# Patient Record
Sex: Female | Born: 1950 | Race: White | Hispanic: No | Marital: Married | Smoking: Never smoker
Health system: Southern US, Community
[De-identification: ages and names within clinical notes are randomized; demographics above are authoritative.]

## PROBLEM LIST (undated history)

## (undated) DIAGNOSIS — N6009 Solitary cyst of unspecified breast: Secondary | ICD-10-CM

## (undated) DIAGNOSIS — I493 Ventricular premature depolarization: Secondary | ICD-10-CM

## (undated) DIAGNOSIS — E079 Disorder of thyroid, unspecified: Secondary | ICD-10-CM

## (undated) DIAGNOSIS — I10 Essential (primary) hypertension: Secondary | ICD-10-CM

## (undated) DIAGNOSIS — E039 Hypothyroidism, unspecified: Secondary | ICD-10-CM

## (undated) HISTORY — PX: APPENDECTOMY: SHX54

## (undated) HISTORY — PX: BREAST CYST ASPIRATION: SHX578

## (undated) HISTORY — PX: EYE SURGERY: SHX253

## (undated) HISTORY — PX: CHOLECYSTECTOMY: SHX55

## (undated) HISTORY — PX: ANKLE SURGERY: SHX546

## (undated) HISTORY — PX: TONSILLECTOMY: SUR1361

---

## 1979-09-16 HISTORY — PX: OTHER SURGICAL HISTORY: SHX169

## 2003-09-14 ENCOUNTER — Other Ambulatory Visit: Payer: Self-pay

## 2010-12-30 ENCOUNTER — Ambulatory Visit: Payer: Self-pay | Admitting: Internal Medicine

## 2010-12-31 ENCOUNTER — Ambulatory Visit: Payer: Self-pay | Admitting: Surgery

## 2011-01-02 LAB — PATHOLOGY REPORT

## 2013-05-24 ENCOUNTER — Emergency Department: Payer: Self-pay | Admitting: Emergency Medicine

## 2013-05-24 LAB — CBC
HCT: 36.1 % (ref 35.0–47.0)
RBC: 4.2 10*6/uL (ref 3.80–5.20)
RDW: 13.8 % (ref 11.5–14.5)

## 2013-05-24 LAB — COMPREHENSIVE METABOLIC PANEL
Albumin: 3.8 g/dL (ref 3.4–5.0)
Alkaline Phosphatase: 116 U/L (ref 50–136)
BUN: 12 mg/dL (ref 7–18)
Bilirubin,Total: 0.3 mg/dL (ref 0.2–1.0)
Calcium, Total: 8.9 mg/dL (ref 8.5–10.1)
Creatinine: 0.88 mg/dL (ref 0.60–1.30)
EGFR (Non-African Amer.): >60
Osmolality: 275 (ref 275–301)
Potassium: 3.9 mmol/L (ref 3.5–5.1)
SGOT(AST): 25 U/L (ref 15–37)
Total Protein: 7.7 g/dL (ref 6.4–8.2)

## 2013-05-24 LAB — URINALYSIS, COMPLETE
Ketone: NEGATIVE
Leukocyte Esterase: NEGATIVE
Ph: 6 (ref 4.5–8.0)
Protein: NEGATIVE
RBC,UR: 1 /HPF (ref 0–5)
Squamous Epithelial: 6

## 2013-07-14 ENCOUNTER — Emergency Department: Payer: Self-pay | Admitting: Emergency Medicine

## 2013-07-14 LAB — T4, FREE: Free Thyroxine: 1.03 ng/dL (ref 0.76–1.46)

## 2013-07-14 LAB — COMPREHENSIVE METABOLIC PANEL
Albumin: 3.9 g/dL (ref 3.4–5.0)
Alkaline Phosphatase: 113 U/L (ref 50–136)
Anion Gap: 4 — ABNORMAL LOW (ref 7–16)
Bilirubin,Total: 0.3 mg/dL (ref 0.2–1.0)
Calcium, Total: 9.2 mg/dL (ref 8.5–10.1)
Chloride: 109 mmol/L — ABNORMAL HIGH (ref 98–107)
Co2: 27 mmol/L (ref 21–32)
Creatinine: 0.95 mg/dL (ref 0.60–1.30)
EGFR (African American): >60
EGFR (Non-African Amer.): >60
Glucose: 97 mg/dL (ref 65–99)
Osmolality: 278 (ref 275–301)
SGOT(AST): 33 U/L (ref 15–37)
SGPT (ALT): 26 U/L (ref 12–78)

## 2013-07-14 LAB — CBC
HGB: 12.8 g/dL (ref 12.0–16.0)
MCV: 87 fL (ref 80–100)
Platelet: 322 10*3/uL (ref 150–440)
RDW: 13.8 % (ref 11.5–14.5)

## 2013-07-14 LAB — CK TOTAL AND CKMB (NOT AT ARMC): CK-MB: 1.2 ng/mL (ref 0.5–3.6)

## 2013-07-14 LAB — TROPONIN I: Troponin-I: <0.02 ng/mL

## 2014-10-24 ENCOUNTER — Ambulatory Visit: Payer: Self-pay | Admitting: Internal Medicine

## 2015-06-28 ENCOUNTER — Ambulatory Visit
Admission: EM | Admit: 2015-06-28 | Discharge: 2015-06-28 | Disposition: A | Discharge status: Home / Self Care | Payer: Medicare Other | Attending: Family Medicine | Admitting: Family Medicine

## 2015-06-28 DIAGNOSIS — N390 Urinary tract infection, site not specified: Secondary | ICD-10-CM

## 2015-06-28 HISTORY — DX: Essential (primary) hypertension: I10

## 2015-06-28 HISTORY — DX: Disorder of thyroid, unspecified: E07.9

## 2015-06-28 LAB — URINALYSIS COMPLETE WITH MICROSCOPIC (ARMC ONLY)
Bilirubin Urine: NEGATIVE
Glucose, UA: NEGATIVE mg/dL
KETONES UR: NEGATIVE mg/dL
Nitrite: POSITIVE — AB
Protein, ur: NEGATIVE mg/dL
Specific Gravity, Urine: 1.005 (ref 1.005–1.030)
pH: 5 (ref 5.0–8.0)

## 2015-06-28 MED ORDER — NITROFURANTOIN MONOHYD MACRO 100 MG PO CAPS: 100.0000 mg | ORAL_CAPSULE | Freq: Two times a day (BID) | ORAL | Status: DC

## 2015-06-28 NOTE — Discharge Instructions (Signed)

## 2015-06-28 NOTE — ED Notes (Signed)
Pt states "I started having urinary pain yesterday."

## 2015-06-28 NOTE — ED Provider Notes (Signed)
[facility] Emergency Department Provider Note  ____________________________________________  Time seen: Approximately 8:47 AM  I have reviewed the triage vital signs and the nursing notes.   HISTORY  Chief Complaint Urinary Frequency   HPI Abigail Nguyen is a 64 y.o. female presents with complaints of urinary frequency, urgency and burning with urination. Patient reports symptoms present since yesterday evening. Patient reports some intermittent lower mid abdominal pressure but denies pain. States urinary pain is only at time of urination. Denies current pain. States urinary pain is 3-4 out of 10 described as burning. Denies pain radiation. Denies back pain. Denies fever, nausea, vomiting, diarrhea, constipation. Reports does continue to eat and drink well. Denies vaginal discharge or vaginal odor or vaginal pain. States took Azo over the counter which helped with urinary burning sensation, but turned urine red color. Denies redness in urine prior to taking Azo.   Patient does reports that she has a chronic history of urinary tract infection since a child. Patient reports in the past she follows with urology. Patient does report that her last urinary tract infection was approximately 1.5-2 years ago. Denies recent antibiotic use.    Past Medical History  Diagnosis Date  . Hypertension   . Thyroid disease   post menopausal   There are no active problems to display for this patient.   Past Surgical History  Procedure Laterality Date  . Cesarean section    . Tonsillectomy    . Appendectomy    . Cholecystectomy    . Ankle surgery    . Eye surgery      Current Outpatient Rx  Name  Route  Sig  Dispense  Refill  . levothyroxine (SYNTHROID, LEVOTHROID) 25 MCG tablet   Oral   Take 25 mcg by mouth daily before breakfast.         . lisinopril (PRINIVIL,ZESTRIL) 10 MG tablet   Oral   Take 10 mg by mouth daily.           Allergies Compazine and  Morphine and related  No family history on file.  Social History Social History  Substance Use Topics  . Smoking status: Never Smoker   . Smokeless tobacco: None  . Alcohol Use: No    Review of Systems Constitutional: No fever/chills Eyes: No visual changes. ENT: No sore throat. Cardiovascular: Denies chest pain. Respiratory: Denies shortness of breath. Gastrointestinal: No abdominal pain.  No nausea, no vomiting.  No diarrhea.  No constipation. Genitourinary: positive for dysuria. Musculoskeletal: Negative for back pain. Skin: Negative for rash. Neurological: Negative for headaches, focal weakness or numbness.  10-point ROS otherwise negative.  ____________________________________________   PHYSICAL EXAM:  VITAL SIGNS: ED Triage Vitals  Enc Vitals Group     BP 06/28/15 0840 123/75 mmHg     Pulse Rate 06/28/15 0840 84     Resp 06/28/15 0840 16     Temp 06/28/15 0840 98.4 F (36.9 C)     Temp Source 06/28/15 0840 Tympanic     SpO2 06/28/15 0840 100 %     Weight --      Height --      Head Cir --      Peak Flow --      Pain Score 06/28/15 0837 4     Pain Loc --      Pain Edu? --      Excl. in [place]? --     Constitutional: Alert and oriented. Well appearing and in no acute distress.  Eyes: Conjunctivae are normal. PERRL. EOMI. Head: Atraumatic.  Nose: No congestion/rhinnorhea.  Mouth/Throat: Mucous membranes are moist.  Oropharynx non-erythematous. Neck: No stridor.  No cervical spine tenderness to palpation. Hematological/Lymphatic/Immunilogical: No cervical lymphadenopathy. Cardiovascular: Normal rate, regular rhythm. Grossly normal heart sounds.  Good peripheral circulation. Respiratory: Normal respiratory effort.  No retractions. Lungs CTAB. Gastrointestinal: Soft and nontender. No distention. Normal Bowel sounds.  No abdominal bruits. No CVA tenderness. Musculoskeletal: No lower or upper extremity tenderness nor edema.  .  Neurologic:  Normal speech and  language. No gross focal neurologic deficits are appreciated. No gait instability. Skin:  Skin is warm, dry and intact. No rash noted. Psychiatric: Mood and affect are normal. Speech and behavior are normal.  ____________________________________________   LABS (all labs ordered are listed, but only abnormal results are displayed)  Labs Reviewed  URINALYSIS COMPLETEWITH MICROSCOPIC ([place]) - Abnormal; Notable for the following:    Color, Urine AMBER (*)    APPearance HAZY (*)    Hgb urine dipstick 1+ (*)    Nitrite POSITIVE (*)    Leukocytes, UA 2+ (*)    Bacteria, UA MANY (*)    Squamous Epithelial / LPF 0-5 (*)    All other components within normal limits  URINE CULTURE     INITIAL IMPRESSION / ASSESSMENT AND PLAN / ED COURSE  Pertinent labs & imaging results that were available during my care of the patient were reviewed by me and considered in my medical decision making (see chart for details).  Very well-appearing patient. No acute distress. Presents for the months of urinary frequency, urgency or burning urination 1 day. Patient reports history urinary tract infections and symptoms similar. Denies other complaints. Abdomen soft and nontender. Will evaluate urine.  Urinalysis positive for many bacteria, nitrite positive, 2+ leukocytes and too numerus to count WBCs. Will treat UTI with oral macrobid. Discussed follow up with Primary care physician this week. Discussed follow up and return parameters including no resolution or any worsening concerns. Patient verbalized understanding and agreed to plan.     ____________________________________________   FINAL CLINICAL IMPRESSION(S) / ED DIAGNOSES  Final diagnoses:  UTI (lower urinary tract infection)       Marylene Land, NP 06/28/15 1137

## 2015-06-30 LAB — URINE CULTURE: Culture: >=100000

## 2015-07-05 ENCOUNTER — Ambulatory Visit
Admission: EM | Admit: 2015-07-05 | Discharge: 2015-07-05 | Disposition: A | Discharge status: Home / Self Care | Payer: Medicare Other | Attending: Family Medicine | Admitting: Family Medicine

## 2015-07-05 DIAGNOSIS — N390 Urinary tract infection, site not specified: Secondary | ICD-10-CM | POA: Diagnosis not present

## 2015-07-05 LAB — URINALYSIS COMPLETE WITH MICROSCOPIC (ARMC ONLY)

## 2015-07-05 MED ORDER — CIPROFLOXACIN HCL 500 MG PO TABS: 500.0000 mg | ORAL_TABLET | Freq: Two times a day (BID) | ORAL | Status: DC

## 2015-07-05 NOTE — ED Provider Notes (Signed)
CSN: [id number]     Arrival date & time 07/05/15  1310 History   First MD Initiated Contact with Patient 07/05/15 1419     Chief Complaint  Patient presents with  . Cystitis   (Consider location/radiation/quality/duration/timing/severity/associated sxs/prior Treatment) HPI   This a 64 year old female who was seen here and 06/28/2015 diagnosed with a UTI and placed on Macrobid for 5 days. Patient states that she was improving at 5 days but 2 days after completing her course of therapy began to have the same symptoms as before. She has begun taken AZO because of the burning. Currently she is having the same symptoms of urgency dysuria frequency and in satiety. She is not having any fever or chills. Slight cramping in her lower abdomen and suprapubic area. She's had numerous UTIs in the past and she states that she usually takes 10 days of therapy in order to make the bacteria. Denies any vaginal discharge.  Past Medical History  Diagnosis Date  . Hypertension   . Thyroid disease    Past Surgical History  Procedure Laterality Date  . Cesarean section    . Tonsillectomy    . Appendectomy    . Cholecystectomy    . Ankle surgery    . Eye surgery     Family History  Problem Relation Age of Onset  . Cancer Mother   . Coronary artery disease Father    Social History  Substance Use Topics  . Smoking status: Never Smoker   . Smokeless tobacco: None  . Alcohol Use: Yes     Comment: rarely   OB History    No data available     Review of Systems  Constitutional: Positive for activity change. Negative for fever, chills and fatigue.  Genitourinary: Positive for dysuria, urgency, frequency and difficulty urinating. Negative for vaginal bleeding, vaginal discharge and vaginal pain.  All other systems reviewed and are negative.   Allergies  Compazine and Morphine and related  Home Medications   Prior to Admission medications   Medication Sig Start Date End Date Taking? Authorizing  Provider  levothyroxine (SYNTHROID, LEVOTHROID) 25 MCG tablet Take 25 mcg by mouth daily before breakfast.   Yes Historical Provider, MD  lisinopril (PRINIVIL,ZESTRIL) 10 MG tablet Take 10 mg by mouth daily.   Yes Historical Provider, MD  ciprofloxacin (CIPRO) 500 MG tablet Take 1 tablet (500 mg total) by mouth 2 (two) times daily. 07/05/15   Lorin Picket, PA-C  nitrofurantoin, macrocrystal-monohydrate, (MACROBID) 100 MG capsule Take 1 capsule (100 mg total) by mouth 2 (two) times daily. 06/28/15   Marylene Land, NP   Meds Ordered and Administered this Visit  Medications - No data to display  BP 131/74 mmHg  Pulse 88  Temp(Src) 96.6 F (35.9 C) (Tympanic)  Resp 16  Ht 5' 4.5" (1.638 m)  Wt 190 lb (86.183 kg)  BMI 32.12 kg/m2  SpO2 99% No data found.   Physical Exam  Constitutional: She is oriented to person, place, and time. She appears well-developed and well-nourished. No distress.  HENT:  Head: Normocephalic and atraumatic.  Eyes: Pupils are equal, round, and reactive to light.  Neck: Neck supple.  Pulmonary/Chest: Breath sounds normal. No respiratory distress. She has no wheezes. She has no rales.  Abdominal: Soft. Bowel sounds are normal. She exhibits no distension. There is no tenderness. There is no rebound and no guarding.  There is no CVA tenderness  Musculoskeletal: Normal range of motion. She exhibits no edema or tenderness.  Neurological: She is alert and oriented to person, place, and time.  Skin: Skin is warm and dry. She is not diaphoretic.  Psychiatric: She has a normal mood and affect. Her behavior is normal. Judgment and thought content normal.  Nursing note and vitals reviewed.   ED Course  Procedures (including critical care time)  Labs Review Labs Reviewed  URINALYSIS COMPLETEWITH MICROSCOPIC ([facility] ONLY) - Abnormal; Notable for the following:    Color, Urine ORANGE (*)    Glucose, UA   (*)    Value: TEST NOT REPORTED DUE TO COLOR INTERFERENCE OF  URINE PIGMENT   Bilirubin Urine   (*)    Value: TEST NOT REPORTED DUE TO COLOR INTERFERENCE OF URINE PIGMENT   Ketones, ur   (*)    Value: TEST NOT REPORTED DUE TO COLOR INTERFERENCE OF URINE PIGMENT   Hgb urine dipstick   (*)    Value: TEST NOT REPORTED DUE TO COLOR INTERFERENCE OF URINE PIGMENT   Protein, ur   (*)    Value: TEST NOT REPORTED DUE TO COLOR INTERFERENCE OF URINE PIGMENT   Nitrite   (*)    Value: TEST NOT REPORTED DUE TO COLOR INTERFERENCE OF URINE PIGMENT   Leukocytes, UA   (*)    Value: TEST NOT REPORTED DUE TO COLOR INTERFERENCE OF URINE PIGMENT   Bacteria, UA MANY (*)    Squamous Epithelial / LPF 0-5 (*)    All other components within normal limits  URINE CULTURE    Imaging Review No results found.   Visual Acuity Review  Right Eye Distance:   Left Eye Distance:   Bilateral Distance:    Right Eye Near:   Left Eye Near:    Bilateral Near:         MDM   1. Recurrent UTI (urinary tract infection)    New Prescriptions   CIPROFLOXACIN (CIPRO) 500 MG TABLET    Take 1 tablet (500 mg total) by mouth 2 (two) times daily.  Plan: 1. Test/x-ray results and diagnosis reviewed with patient 2. rx as per orders; risks, benefits, potential side effects reviewed with patient 3. Recommend supportive treatment with fluids,rest 4. F/u prn if symptoms worsen or don't improve     Lorin Picket, PA-C 07/05/15 2034

## 2015-07-05 NOTE — Discharge Instructions (Signed)

## 2015-07-05 NOTE — ED Notes (Signed)
Seen here at [facility] with UTI. Given Rx for Macrobid x 5 days. "Was helping", woke at 0300 hrs. This morning and burning with voiding. Took AZO. C/o headache and burning with voiding and low pelvic discomfort

## 2015-07-07 LAB — URINE CULTURE

## 2015-08-24 ENCOUNTER — Encounter: Payer: Self-pay | Admitting: Emergency Medicine

## 2015-08-24 ENCOUNTER — Ambulatory Visit
Admission: EM | Admit: 2015-08-24 | Discharge: 2015-08-24 | Disposition: A | Discharge status: Home / Self Care | Payer: Medicare Other | Attending: Emergency Medicine | Admitting: Emergency Medicine

## 2015-08-24 DIAGNOSIS — J069 Acute upper respiratory infection, unspecified: Secondary | ICD-10-CM

## 2015-08-24 MED ORDER — AZITHROMYCIN 250 MG PO TABS: ORAL_TABLET | ORAL | Status: DC

## 2015-08-24 NOTE — ED Provider Notes (Signed)
CSN: [id number]     Arrival date & time 08/24/15  [id number] History   None    Chief Complaint  Patient presents with  . Facial Pain  . Nasal Congestion   (Consider location/radiation/quality/duration/timing/severity/associated sxs/prior Treatment) HPI   This 64 year old female who presents with a one-week history of sinus pain and pressure along with nasal congestion. She states that she often have it go into her chest but has not happened so far. Try Mucinex DM without much success. [place] any fever or chills. His had a sore throat which is mostly from the drainage. Coughing is not a dominant feature. Most of the mucus production that she has is clear in color.  Past Medical History  Diagnosis Date  . Hypertension   . Thyroid disease    Past Surgical History  Procedure Laterality Date  . Cesarean section    . Tonsillectomy    . Appendectomy    . Cholecystectomy    . Ankle surgery    . Eye surgery     Family History  Problem Relation Age of Onset  . Cancer Mother   . Coronary artery disease Father    Social History  Substance Use Topics  . Smoking status: Never Smoker   . Smokeless tobacco: None  . Alcohol Use: Yes     Comment: rarely   OB History    No data available     Review of Systems  Constitutional: Positive for fever and chills. Negative for diaphoresis, activity change, appetite change and fatigue.  HENT: Positive for congestion, postnasal drip, rhinorrhea, sinus pressure and sore throat.   Respiratory: Negative for cough and shortness of breath.   All other systems reviewed and are negative.   Allergies  Compazine and Morphine and related  Home Medications   Prior to Admission medications   Medication Sig Start Date End Date Taking? Authorizing Provider  azithromycin (ZITHROMAX Z-PAK) 250 MG tablet Use as per package instructions 08/31/15   Lorin Picket, PA-C  ciprofloxacin (CIPRO) 500 MG tablet Take 1 tablet (500 mg total) by mouth 2 (two) times  daily. 07/05/15   Lorin Picket, PA-C  levothyroxine (SYNTHROID, LEVOTHROID) 25 MCG tablet Take 25 mcg by mouth daily before breakfast.    Historical Provider, MD  lisinopril (PRINIVIL,ZESTRIL) 10 MG tablet Take 10 mg by mouth daily.    Historical Provider, MD  nitrofurantoin, macrocrystal-monohydrate, (MACROBID) 100 MG capsule Take 1 capsule (100 mg total) by mouth 2 (two) times daily. 06/28/15   Marylene Land, NP   Meds Ordered and Administered this Visit  Medications - No data to display  BP 142/89 mmHg  Pulse 78  Temp(Src) 97.1 F (36.2 C) (Tympanic)  Resp 16  Ht 5\' 4"  (1.626 m)  Wt 189 lb (85.73 kg)  BMI 32.43 kg/m2  SpO2 99% No data found.   Physical Exam  Constitutional: She is oriented to person, place, and time. She appears well-developed and well-nourished. No distress.  HENT:  Head: Normocephalic and atraumatic.  Right Ear: External ear normal.  Nose: Nose normal.  Mouth/Throat: Oropharynx is clear and moist. No oropharyngeal exudate.  Left TM is very dull and dark  Neck: Normal range of motion. Neck supple.  Pulmonary/Chest: Effort normal and breath sounds normal. No respiratory distress. She has no wheezes. She has no rales.  Musculoskeletal: Normal range of motion. She exhibits no edema or tenderness.  Lymphadenopathy:    She has no cervical adenopathy.  Neurological: She is alert and oriented to person,  place, and time.  Skin: Skin is warm and dry. She is not diaphoretic.  Psychiatric: She has a normal mood and affect. Her behavior is normal. Judgment and thought content normal.  Vitals reviewed.   ED Course  Procedures (including critical care time)  Labs Review Labs Reviewed - No data to display  Imaging Review No results found.   Visual Acuity Review  Right Eye Distance:   Left Eye Distance:   Bilateral Distance:    Right Eye Near:   Left Eye Near:    Bilateral Near:         MDM   1. URI, acute    Discharge Medication List as of  08/24/2015  9:09 AM    START taking these medications   Details  azithromycin (ZITHROMAX Z-PAK) 250 MG tablet Use as per package instructions, Print      Plan: 1. Diagnosis reviewed with patient 2. rx as per orders; risks, benefits, potential side effects reviewed with patient 3. Recommend supportive treatment with fluids rest. Flonase for drainage. Her that this a viral illness and most likely will not require antibiotics but I have postdated a prescription for her to assist her so she may not have to come back to our facility if she continues to have symptoms. 4. F/u prn if symptoms worsen or don't improve     Lorin Picket, PA-C 08/24/15 1008

## 2015-08-24 NOTE — ED Notes (Signed)
Patient c/o sinus pain and pressure and nasal congestion for a week.

## 2015-08-24 NOTE — Discharge Instructions (Signed)
Cool Mist Vaporizers °Vaporizers may help relieve the symptoms of a cough and cold. They add moisture to the air, which helps mucus to become thinner and less sticky. This makes it easier to breathe and cough up secretions. Cool mist vaporizers do not cause serious burns like hot mist vaporizers, which may also be called steamers or humidifiers. Vaporizers have not been proven to help with colds. You should not use a vaporizer if you are allergic to mold. °HOME CARE INSTRUCTIONS °· Follow the package instructions for the vaporizer. °· Do not use anything other than distilled water in the vaporizer. °· Do not run the vaporizer all of the time. This can cause mold or bacteria to grow in the vaporizer. °· Clean the vaporizer after each time it is used. °· Clean and dry the vaporizer well before storing it. °· Stop using the vaporizer if worsening respiratory symptoms develop. °  °This information is not intended to replace advice given to you by your health care provider. Make sure you discuss any questions you have with your health care provider. °  °Document Released: 05/29/2004 Document Revised: 09/06/2013 Document Reviewed: 01/19/2013 °Elsevier Interactive Patient Education ©2016 [employer] Inc[employer] ° °Upper Respiratory Infection, Adult °Most upper respiratory infections (URIs) are a viral infection of the air passages leading to the lungs. A URI affects the nose, throat, and upper air passages. The most common type of URI is nasopharyngitis and is typically referred to as "the common cold." °URIs run their course and usually go away on their own. Most of the time, a URI does not require medical attention, but sometimes a bacterial infection in the upper airways can follow a viral infection. This is called a secondary infection. Sinus and middle ear infections are common types of secondary upper respiratory infections. °Bacterial pneumonia can also complicate a URI. A URI can worsen asthma and chronic obstructive  pulmonary disease (COPD). Sometimes, these complications can require emergency medical care and may be life threatening.  °CAUSES °Almost all URIs are caused by viruses. A virus is a type of germ and can spread from one person to another.  °RISKS FACTORS °You may be at risk for a URI if:  °· You smoke.   °· You have chronic heart or lung disease. °· You have a weakened defense (immune) system.   °· You are very young or very old.   °· You have nasal allergies or asthma. °· You work in crowded or poorly ventilated areas. °· You work in health care facilities or schools. °SIGNS AND SYMPTOMS  °Symptoms typically develop 2-3 days after you come in contact with a cold virus. Most viral URIs last 7-10 days. However, viral URIs from the influenza virus (flu virus) can last 14-18 days and are typically more severe. Symptoms may include:  °· Runny or stuffy (congested) nose.   °· Sneezing.   °· Cough.   °· Sore throat.   °· Headache.   °· Fatigue.   °· Fever.   °· Loss of appetite.   °· Pain in your forehead, behind your eyes, and over your cheekbones (sinus pain). °· Muscle aches.   °DIAGNOSIS  °Your health care provider may diagnose a URI by: °· Physical exam. °· Tests to check that your symptoms are not due to another condition such as: °¨ Strep throat. °¨ Sinusitis. °¨ Pneumonia. °¨ Asthma. °TREATMENT  °A URI goes away on its own with time. It cannot be cured with medicines, but medicines may be prescribed or recommended to relieve symptoms. Medicines may help: °· Reduce your fever. °· Reduce   your cough. °· Relieve nasal congestion. °HOME CARE INSTRUCTIONS  °· Take medicines only as directed by your health care provider.   °· Gargle warm saltwater or take cough drops to comfort your throat as directed by your health care provider. °· Use a warm mist humidifier or inhale steam from a shower to increase air moisture. This may make it easier to breathe. °· Drink enough fluid to keep your urine clear or pale yellow.   °· Eat  soups and other clear broths and maintain good nutrition.   °· Rest as needed.   °· Return to work when your temperature has returned to normal or as your health care provider advises. You may need to stay home longer to avoid infecting others. You can also use a face mask and careful hand washing to prevent spread of the virus. °· Increase the usage of your inhaler if you have asthma.   °· Do not use any tobacco products, including cigarettes, chewing tobacco, or electronic cigarettes. If you need help quitting, ask your health care provider. °PREVENTION  °The best way to protect yourself from getting a cold is to practice good hygiene.  °· Avoid oral or hand contact with people with cold symptoms.   °· Wash your hands often if contact occurs.   °There is no clear evidence that vitamin C, vitamin E, echinacea, or exercise reduces the chance of developing a cold. However, it is always recommended to get plenty of rest, exercise, and practice good nutrition.  °SEEK MEDICAL CARE IF:  °· You are getting worse rather than better.   °· Your symptoms are not controlled by medicine.   °· You have chills. °· You have worsening shortness of breath. °· You have brown or red mucus. °· You have yellow or brown nasal discharge. °· You have pain in your face, especially when you bend forward. °· You have a fever. °· You have swollen neck glands. °· You have pain while swallowing. °· You have white areas in the back of your throat. °SEEK IMMEDIATE MEDICAL CARE IF:  °· You have severe or persistent: °¨ Headache. °¨ Ear pain. °¨ Sinus pain. °¨ Chest pain. °· You have chronic lung disease and any of the following: °¨ Wheezing. °¨ Prolonged cough. °¨ Coughing up blood. °¨ A change in your usual mucus. °· You have a stiff neck. °· You have changes in your: °¨ Vision. °¨ Hearing. °¨ Thinking. °¨ Mood. °MAKE SURE YOU:  °· Understand these instructions. °· Will watch your condition. °· Will get help right away if you are not doing well or  get worse. °  °This information is not intended to replace advice given to you by your health care provider. Make sure you discuss any questions you have with your health care provider. °  °Document Released: 02/25/2001 Document Revised: 01/16/2015 Document Reviewed: 12/07/2013 °Elsevier Interactive Patient Education ©2016 Elsevier Inc. ° °

## 2015-08-30 MED ORDER — AZITHROMYCIN 250 MG PO TABS: ORAL_TABLET | ORAL | Status: DC

## 2015-10-16 ENCOUNTER — Ambulatory Visit: Payer: Medicare Other | Attending: Internal Medicine | Admitting: Physical Therapy

## 2015-10-16 DIAGNOSIS — R262 Difficulty in walking, not elsewhere classified: Secondary | ICD-10-CM | POA: Diagnosis present

## 2015-10-16 DIAGNOSIS — M67951 Unspecified disorder of synovium and tendon, right thigh: Secondary | ICD-10-CM

## 2015-10-16 DIAGNOSIS — M7601 Gluteal tendinitis, right hip: Secondary | ICD-10-CM | POA: Insufficient documentation

## 2015-10-16 NOTE — Patient Instructions (Signed)
All exercises provided were adapted from [email or web address]. Patient was provided a written handout with pictures as described. Any additional cues were manually entered in to handout and copied in to this document.  STANDING EXTENSIONS  While standing, place your hands on your hips and lean back to arch your back.   Piriformis Stretch  Flex hip up towards body and pull across body towards opposite side.    Glute Release with Tennis [employer] the tennis ball on the wall and maintain pressure with your back to the wall. Use your knees to bend up and down so the ball rolls over the area that is sore.

## 2015-10-16 NOTE — Therapy (Signed)
[facility] [address], [place], [address] Phone: [phone]   Fax:  [phone]  Physical Therapy Evaluation  Patient Details  Name: Abigail Nguyen MRN: [id number] Date of Birth: 07-22-1951 No Data Recorded  Encounter Date: 10/16/2015      PT End of Session - 10/16/15 1256    Visit Number 1   Number of Visits 9   Date for PT Re-Evaluation 11/20/15   PT Start Time 0803   PT Stop Time 0902   PT Time Calculation (min) 59 min   Activity Tolerance Patient tolerated treatment well   Behavior During Therapy [facility] for tasks assessed/performed      Past Medical History  Diagnosis Date  . Hypertension   . Thyroid disease     Past Surgical History  Procedure Laterality Date  . Cesarean section    . Tonsillectomy    . Appendectomy    . Cholecystectomy    . Ankle surgery    . Eye surgery      There were no vitals filed for this visit.  Visit Diagnosis:  Tendinopathy of right gluteal region - Plan: PT plan of care cert/re-cert  Difficulty walking - Plan: PT plan of care cert/re-cert      Subjective Assessment - 10/16/15 1536    Subjective Patient reports she has had several months of increasing R hip pain. Patient reports she has had several episodes of similar cases in the past, which resolved with PT treatment. She reports pain with sitting for prolonged periods of time on her R hip.    Pertinent History L ankle fracture roughly 4 years ago.    Limitations Walking;Sitting   Patient Stated Goals Patient would like to begin water aerobics as well as participate in [employer].    Currently in Pain? Yes   Pain Score 6    Pain Location Hip   Pain Orientation Posterior;Right   Pain Descriptors / Indicators Aching   Pain Type Chronic pain   Pain Onset More than a month ago   Pain Frequency Intermittent   Aggravating Factors  Going up steps or an incline, sitting for too long.    Pain  Relieving Factors Icing.             [facility] PT Assessment - 10/16/15 1256    Assessment   Medical Diagnosis --  Arthritis   Precautions   Precautions None   Restrictions   Weight Bearing Restrictions No   Balance Screen   Has the patient fallen in the past 6 months No   Prior Function   Level of Independence Independent   Cognition   Overall Cognitive Status Within Functional Limits for tasks assessed   Observation/Other Assessments   Lower Extremity Functional Scale  38   Sensation   Light Touch Appears Intact   Strength   Right Hip Flexion 4+/5  Painful   Right Hip Extension 3+/5  Very painful   Right Hip External Rotation  4-/5  Very painful   Right Hip Internal Rotation 4+/5   Right Hip ABduction 4-/5  very painful   Left Hip Flexion 5/5   Left Hip External Rotation 5/5   Left Hip Internal Rotation 5/5   Left Hip ADduction 5/5   Right Knee Flexion 5/5   Right Knee Extension 5/5  Pain noted   Left Knee Flexion 5/5   Left Knee Extension 5/5   Right Ankle Dorsiflexion 5/5   Right Ankle  Plantar Flexion 5/5   Right Ankle Inversion 5/5   Right Ankle Eversion 5/5   Left Ankle Dorsiflexion 5/5   Left Ankle Plantar Flexion 5/5   Left Ankle Inversion 5/5   Left Ankle Eversion 5/5   Palpation   Palpation comment --  Multiple trigger points identified in glute region on R   FABER test   findings Positive   Side Right   Straight Leg Raise   Findings Negative   Comment --  Painful on R due to elongation of hip extensors.   Criag's Test    Findings Negative   Ely's Test   Findings Negative   Hip Scouring   Findings Negative      Manual Therapy  T7-T10 reports of pain ("reproducible signs") with reproduction of pain in R gluteal area. Patient reports grade II mobilizations felt relieving of the painful area. No reproduction of pain or joint signs in lumbar spine with overpressure  Repeated movements of standing extension (trunk) x 8 with 5" holds, reduced  symptoms she initially feels while flexing her trunk Soft tissue mobilization to tender area identified in gluteal region (in roughly the mid-point of muscle belly of gluteus medius).  Manual overpressure of RLE with piriformis stretch x 8 repetitions for 2 sets with 3-5" holds with mild increase in pain noted with stretching sensation.                     PT Education - 10/16/15 0904    Education provided Yes   Education Details Educated patient on timeline for rehab, probable source of symptoms and rationale for why dysfunction occurred. Use of pillow for sitting to relieve focal pressure.    Person(s) Educated Patient   Methods Explanation;Demonstration;Handout   Comprehension Verbalized understanding;Returned demonstration             PT Long Term Goals - 10/16/15 1546    PT LONG TERM GOAL #1   Title Patient will report an LEFs score of greater than 60/80 to demonstrate improved tolerance for functional activities.    Baseline 38/80   Time 4   Period Weeks   Status New   PT LONG TERM GOAL #2   Title Patient will ascend and descend the 3 flights of stairs at her workspace with no increase in symptoms to return to functional tasks independently.    Baseline Unable to ascend steps.    Time 4   Period Weeks   Status New   PT LONG TERM GOAL #3   Title Patient will report a worst pain of no more than 3/10 in the previous week to demonstrate improved tolerance for functional mobility    Baseline 8/10   Time 4   Period Weeks   Status New               Plan - 10/16/15 1532    Clinical Impression Statement Patient demonstrates pain with tension passively or actively applied to her R gluteal musculature, which she has had episodes of previously. She responded well to manual treatments and stretching provided today with decreased pain with stair ascent. Patient also demonstrates joint signs in mid to lower thoracic spine which relieved pain in R posterior thigh  after CPAs applied. Patient would benefit from skilled PT services to address her mobility deficits.   Pt will benefit from skilled therapeutic intervention in order to improve on the following deficits Abnormal gait;Pain   Rehab Potential Good   Clinical Impairments Affecting Rehab  Potential Previous resolution of symptoms with PT.    PT Frequency 2x / week   PT Duration 4 weeks   PT Treatment/Interventions Cryotherapy;Therapeutic activities;Therapeutic exercise;Manual techniques;Taping;Gait training;Dry needling   PT Next Visit Plan Progress manual techniques as tolerated, isometric and active posterior hip strengthening as tolerated.    PT Home Exercise Plan See patient instructions.    Consulted and Agree with Plan of Care Patient          G-Codes - 11-13-15 1255    Functional Assessment Tool Used Squat, LEFS, Clinical judgement    Functional Limitation Mobility: Walking and moving around   Mobility: Walking and Moving Around Current Status [phone]) At least 40 percent but less than 60 percent impaired, limited or restricted   Mobility: Walking and Moving Around Goal Status [phone]) At least 1 percent but less than 20 percent impaired, limited or restricted       Problem List There are no active problems to display for this patient.  Kerman Passey, PT, DPT    Nov 13, 2015, 5:32 PM  [facility] [address] [place], [address] Phone: [phone]   Fax:  [phone]  Name: JUSTUS STREBE MRN: [id number] Date of Birth: 1951-04-10

## 2015-10-18 ENCOUNTER — Ambulatory Visit: Payer: Medicare Other | Attending: Internal Medicine | Admitting: Physical Therapy

## 2015-10-18 DIAGNOSIS — M67951 Unspecified disorder of synovium and tendon, right thigh: Secondary | ICD-10-CM

## 2015-10-18 DIAGNOSIS — M7601 Gluteal tendinitis, right hip: Secondary | ICD-10-CM | POA: Diagnosis not present

## 2015-10-18 DIAGNOSIS — R262 Difficulty in walking, not elsewhere classified: Secondary | ICD-10-CM | POA: Diagnosis present

## 2015-10-18 NOTE — Therapy (Signed)
[facility] [address] [place], [address] Phone: [phone]   Fax:  [phone]  Physical Therapy Treatment  Patient Details  Name: Abigail Nguyen MRN: [id number] Date of Birth: [birth year] No Data Recorded  Encounter Date: 10/18/2015      PT End of Session - 10/18/15 1814    Visit Number 2   Number of Visits 9   Date for PT Re-Evaluation 11/20/15   PT Start Time [id number]   PT Stop Time 1700   PT Time Calculation (min) 45 min   Activity Tolerance Patient tolerated treatment well   Behavior During Therapy [facility] for tasks assessed/performed      Past Medical History  Diagnosis Date  . Hypertension   . Thyroid disease     Past Surgical History  Procedure Laterality Date  . Cesarean section    . Tonsillectomy    . Appendectomy    . Cholecystectomy    . Ankle surgery    . Eye surgery      There were no vitals filed for this visit.  Visit Diagnosis:  Tendinopathy of right gluteal region  Difficulty walking      Subjective Assessment - 10/18/15 1615    Subjective Patient reports she has noticed some improvement in her symptoms with descending stairs, however she continues to have pain with inclines and ascending steps. She reports she forgot to bring a pillow to sit on for her chair.    Pertinent History L ankle fracture roughly 4 years ago.    Limitations Walking;Sitting   Patient Stated Goals Patient would like to begin water aerobics as well as participate in [employer].    Currently in Pain? Yes   Pain Score 5    Pain Location Hip   Pain Orientation Right;Posterior   Pain Descriptors / Indicators Aching   Pain Type Chronic pain   Pain Onset More than a month ago   Pain Frequency Intermittent   Aggravating Factors  going up steps/incline   Pain Relieving Factors icing      Sidelying hip abductions 3 sets x 10 repetitions with no weakness noted, minimal if any increase in  pain  Thoracic and reproducible sign spot mobilizations  In thoracic and lumbar spine grade III mobilizations which were well tolerated, grade IV mobilizations to trigger point in R gluteal insertion/sacral region. 3 bouts each lasting 30-45". Well tolerated with reports of less pain afterwards.   Sidelying lumbar rotations grade III for 3 bouts x 1 minute, no real change in symptoms it appeared afterwards, though she indicated a stretch sensation in the R flank.   Stair ascent/descent  Multiple bouts, on ascent patient noted to lack knee extensor strength and compensate via hip extensors.   Prone hip extensions with manual pressure on trigger point -- noted reduction in pain with pressure on trigger point. 3 sets x 10 repetitions.   LAQs with yellow, red, green, blue bands x 15 at each. Blue band deemed challenging.                            PT Education - 10/18/15 1813    Education provided Yes   Education Details Stressed to patient use of pillow and progression of HEP.    Person(s) Educated Patient   Methods Explanation;Demonstration;Handout   Comprehension Verbalized understanding;Returned demonstration             PT Long  Term Goals - 10/16/15 1546    PT LONG TERM GOAL #1   Title Patient will report an LEFs score of greater than 60/80 to demonstrate improved tolerance for functional activities.    Baseline 38/80   Time 4   Period Weeks   Status New   PT LONG TERM GOAL #2   Title Patient will ascend and descend the 3 flights of stairs at her workspace with no increase in symptoms to return to functional tasks independently.    Baseline Unable to ascend steps.    Time 4   Period Weeks   Status New   PT LONG TERM GOAL #3   Title Patient will report a worst pain of no more than 3/10 in the previous week to demonstrate improved tolerance for functional mobility    Baseline 8/10   Time 4   Period Weeks   Status New               Plan -  10/18/15 1815    Clinical Impression Statement Patient is reporting less pain with stair descent, and tolerating stair ascent better after treatment in this session. She seems to have dysfunction around gluteal insertion on sacrum on R, which responds well to manual techniques. Patient tolerating hip extension with pressure on trigger point well also, she seems to be less symptomatic/irritable in this session.    Pt will benefit from skilled therapeutic intervention in order to improve on the following deficits Abnormal gait;Pain   Rehab Potential Good   Clinical Impairments Affecting Rehab Potential Previous resolution of symptoms with PT.    PT Frequency 2x / week   PT Duration 4 weeks   PT Treatment/Interventions Cryotherapy;Therapeutic activities;Therapeutic exercise;Manual techniques;Taping;Gait training;Dry needling   PT Next Visit Plan Progress manual techniques as tolerated, isometric and active posterior hip strengthening as tolerated.    PT Home Exercise Plan See patient instructions.    Consulted and Agree with Plan of Care Patient        Problem List There are no active problems to display for this patient.  Kerman Passey, PT, DPT    10/18/2015, 6:24 PM  [facility] [address], [place], [address] Phone: [phone]   Fax:  [phone]  Name: Abigail Nguyen MRN: [id number] Date of Birth: 02-11-1951

## 2015-10-22 ENCOUNTER — Ambulatory Visit: Payer: Medicare Other | Admitting: Physical Therapy

## 2015-10-22 DIAGNOSIS — M7601 Gluteal tendinitis, right hip: Secondary | ICD-10-CM

## 2015-10-22 DIAGNOSIS — M67951 Unspecified disorder of synovium and tendon, right thigh: Secondary | ICD-10-CM

## 2015-10-22 DIAGNOSIS — R262 Difficulty in walking, not elsewhere classified: Secondary | ICD-10-CM

## 2015-10-23 NOTE — Therapy (Signed)
[facility] [address], [place], [address] Phone: [phone]   Fax:  [phone]  Physical Therapy Treatment  Patient Details  Name: Abigail Nguyen MRN: [id number] Date of Birth: [birth year] No Data Recorded  Encounter Date: 10/22/2015      PT End of Session - 10/22/15 0954    Visit Number 3   Number of Visits 9   Date for PT Re-Evaluation 11/20/15   PT Start Time 0902   PT Stop Time 0944   PT Time Calculation (min) 42 min   Activity Tolerance Patient tolerated treatment well   Behavior During Therapy [facility] for tasks assessed/performed      Past Medical History  Diagnosis Date  . Hypertension   . Thyroid disease     Past Surgical History  Procedure Laterality Date  . Cesarean section    . Tonsillectomy    . Appendectomy    . Cholecystectomy    . Ankle surgery    . Eye surgery      There were no vitals filed for this visit.  Visit Diagnosis:  Tendinopathy of right gluteal region  Difficulty walking      Subjective Assessment - 10/22/15 0906    Subjective Patient reports she did a good bit of standing and walking on Saturday and as a result felt increased symptoms at night. She reports yesterday was a better day, and considering she has not tried steps today she has relatively little pain.    Pertinent History L ankle fracture roughly 4 years ago.    Limitations Walking;Sitting   Patient Stated Goals Patient would like to begin water aerobics as well as participate in [employer].    Currently in Pain? Yes   Pain Score 1    Pain Location Hip   Pain Orientation Right;Posterior   Pain Descriptors / Indicators Aching   Pain Type Chronic pain   Pain Onset More than a month ago   Pain Frequency Intermittent   Aggravating Factors  Going up steps/incline    Pain Relieving Factors icing       Manual Therapy   Manual CPAs at T7 - L5, joint signs at L4-L5 performed 3 bouts of 30" of  grade III mobilizations at each    TherEx Prone hip extensions x 30" for 3 repetitions (painful in thigh with knee extended,   Sidelying isometric contractions x 30" for 3 sets   Supine bridging 3 sets x 5 repetitions with 5" holds. Cuing for pushing through heels   Side stepping x 12 repetitions                            PT Education - 10/22/15 0953    Education provided Yes   Education Details Educated patient on activity modification, to monitor her symptoms in the evenings for modification, progression to isometric holds and low level loading of tendons.    Person(s) Educated Patient   Methods Explanation;Demonstration;Handout   Comprehension Verbalized understanding;Returned demonstration             PT Long Term Goals - 10/16/15 1546    PT LONG TERM GOAL #1   Title Patient will report an LEFs score of greater than 60/80 to demonstrate improved tolerance for functional activities.    Baseline 38/80   Time 4   Period Weeks   Status New   PT LONG TERM GOAL #2   Title  Patient will ascend and descend the 3 flights of stairs at her workspace with no increase in symptoms to return to functional tasks independently.    Baseline Unable to ascend steps.    Time 4   Period Weeks   Status New   PT LONG TERM GOAL #3   Title Patient will report a worst pain of no more than 3/10 in the previous week to demonstrate improved tolerance for functional mobility    Baseline 8/10   Time 4   Period Weeks   Status New               Plan - 10/22/15 [id number]    Clinical Impression Statement Patient continues to report improvement in symptoms, with decreased resting pain and decreased pain with ascending steps. She continues to have pain with prolonged activity, and would benefit from additional time with activity modifications (decreased inclines/steps, more transitions from sitting to standing to minimize her time in one position.). Patient progressed to long  duration low load isometrics and low load activities on gluteal tendons today with no immediate increase in symptoms.    Pt will benefit from skilled therapeutic intervention in order to improve on the following deficits Abnormal gait;Pain   Rehab Potential Good   Clinical Impairments Affecting Rehab Potential Previous resolution of symptoms with PT.    PT Frequency 2x / week   PT Duration 4 weeks   PT Treatment/Interventions Cryotherapy;Therapeutic activities;Therapeutic exercise;Manual techniques;Taping;Gait training;Dry needling   PT Next Visit Plan Progress manual techniques as tolerated, isometric and active posterior hip strengthening as tolerated.    PT Home Exercise Plan See patient instructions.    Consulted and Agree with Plan of Care Patient        Problem List There are no active problems to display for this patient.  Kerman Passey, PT, DPT    10/23/2015, 9:42 AM  [facility] [address], [place], [address] Phone: [phone]   Fax:  [phone]  Name: Abigail Nguyen MRN: [id number] Date of Birth: [birth year]

## 2015-10-25 ENCOUNTER — Encounter: Payer: Medicare Other | Admitting: Physical Therapy

## 2015-10-30 ENCOUNTER — Ambulatory Visit: Payer: Medicare Other | Admitting: Physical Therapy

## 2015-10-30 DIAGNOSIS — M7601 Gluteal tendinitis, right hip: Secondary | ICD-10-CM

## 2015-10-30 DIAGNOSIS — R262 Difficulty in walking, not elsewhere classified: Secondary | ICD-10-CM

## 2015-10-30 DIAGNOSIS — M67951 Unspecified disorder of synovium and tendon, right thigh: Secondary | ICD-10-CM

## 2015-10-30 NOTE — Therapy (Signed)
[facility] [address], [place], [address] Phone: [phone]   Fax:  [phone]  Physical Therapy Treatment  Patient Details  Name: Abigail Nguyen MRN: [id number] Date of Birth: 1950/10/18 No Data Recorded  Encounter Date: 10/30/2015      PT End of Session - 10/30/15 1155    Visit Number 4   Number of Visits 9   Date for PT Re-Evaluation 11/20/15   PT Start Time 0945   PT Stop Time 1025   PT Time Calculation (min) 40 min   Activity Tolerance Patient tolerated treatment well   Behavior During Therapy [facility] for tasks assessed/performed      Past Medical History  Diagnosis Date  . Hypertension   . Thyroid disease     Past Surgical History  Procedure Laterality Date  . Cesarean section    . Tonsillectomy    . Appendectomy    . Cholecystectomy    . Ankle surgery    . Eye surgery      There were no vitals filed for this visit.  Visit Diagnosis:  Tendinopathy of right gluteal region  Difficulty walking      Subjective Assessment - 10/30/15 1154    Subjective Patient reports she has continued to be quite active, some mild pain over the weekend with increase activity. Patient reports she has performed limited attempts at steps over the weekend without stabbing pain anymore, appears to be more a short ache.    Pertinent History L ankle fracture roughly 4 years ago.    Limitations Walking;Sitting   Patient Stated Goals Patient would like to begin water aerobics as well as participate in [employer].    Currently in Pain? No/denies  2/10 pain in hip with trunk rotations. No pain ottherwise      TherEx  Stair ascent 3 x 4 steps, cuing to increase stance time on LLE, no use of hand rails. Mild increase in pain (no more than a 2/10) Sit to stand no real increase in pain in this session.  Trunk rotations, pain with rotation to her L, not R.  Retro gait to increase hip extensor activity -  30', progressed to yellow t-band x 60', progressed to red t-band x 60' (challenging but not painful)  Side stepping with yellow tband x 10 steps for 2 rounds bilaterally, not challenging enough progressed to red-tband for 2 rounds of 10 at a time, 2 sets.  Single leg deadlifts x 10 bilaterally for 2 sets with 2nd set involving fingers maintaining balance on railing.  Suitcase carry with 5# DB (not challenging) in LUE, progressed to 60' x 2 with 7# DB (more challenging).   ** Patient reports significantly less pain with stair ascent on this session. She reports some pain with SLS on R side still.                            PT Education - 10/30/15 1154    Education provided Yes   Education Details Educated patient to attempt stairs, increase her HEP, her progress with therapy.    Person(s) Educated Patient   Methods Explanation;Demonstration;Handout   Comprehension Verbalized understanding;Returned demonstration             PT Long Term Goals - 10/16/15 1546    PT LONG TERM GOAL #1   Title Patient will report an LEFs score of greater than 60/80 to demonstrate improved  tolerance for functional activities.    Baseline 38/80   Time 4   Period Weeks   Status New   PT LONG TERM GOAL #2   Title Patient will ascend and descend the 3 flights of stairs at her workspace with no increase in symptoms to return to functional tasks independently.    Baseline Unable to ascend steps.    Time 4   Period Weeks   Status New   PT LONG TERM GOAL #3   Title Patient will report a worst pain of no more than 3/10 in the previous week to demonstrate improved tolerance for functional mobility    Baseline 8/10   Time 4   Period Weeks   Status New               Plan - 10/30/15 1156    Clinical Impression Statement Patient has been able to participate at increased rate with community activities, though she appears to be avoiding steps at these. Patient reporting more  soreness/ache, than stabbing pain. She does demonstrate continued R hip weakness with stair ascent with poor control in ascent (stance time), provided increased strengthening HEP.    Pt will benefit from skilled therapeutic intervention in order to improve on the following deficits Abnormal gait;Pain   Rehab Potential Good   Clinical Impairments Affecting Rehab Potential Previous resolution of symptoms with PT.    PT Frequency 2x / week   PT Duration 4 weeks   PT Treatment/Interventions Cryotherapy;Therapeutic activities;Therapeutic exercise;Manual techniques;Taping;Gait training;Dry needling   PT Next Visit Plan Progress manual techniques as tolerated, isometric and active posterior hip strengthening as tolerated.    PT Home Exercise Plan See patient instructions.    Consulted and Agree with Plan of Care Patient        Problem List There are no active problems to display for this patient.  Kerman Passey, PT, DPT    10/30/2015, 1:15 PM  [facility] [address] [place], [address] Phone: [phone]   Fax:  [phone]  Name: Abigail Nguyen MRN: [id number] Date of Birth: 1951/02/10

## 2015-11-01 ENCOUNTER — Ambulatory Visit: Payer: Medicare Other | Admitting: Physical Therapy

## 2015-11-01 DIAGNOSIS — M7601 Gluteal tendinitis, right hip: Secondary | ICD-10-CM

## 2015-11-01 DIAGNOSIS — R262 Difficulty in walking, not elsewhere classified: Secondary | ICD-10-CM

## 2015-11-01 DIAGNOSIS — M67951 Unspecified disorder of synovium and tendon, right thigh: Secondary | ICD-10-CM

## 2015-11-01 NOTE — Therapy (Signed)
[facility] [address], [place], [address] Phone: [phone]   Fax:  [phone]  Physical Therapy Treatment  Patient Details  Name: Abigail Nguyen MRN: [id number] Date of Birth: 1951/04/28 No Data Recorded  Encounter Date: 11/01/2015      PT End of Session - 11/01/15 1044    Visit Number 5   Number of Visits 9   Date for PT Re-Evaluation 11/20/15   PT Start Time 0948   PT Stop Time 1028   PT Time Calculation (min) 40 min   Activity Tolerance Patient tolerated treatment well   Behavior During Therapy [facility] for tasks assessed/performed      Past Medical History  Diagnosis Date  . Hypertension   . Thyroid disease     Past Surgical History  Procedure Laterality Date  . Cesarean section    . Tonsillectomy    . Appendectomy    . Cholecystectomy    . Ankle surgery    . Eye surgery      There were no vitals filed for this visit.  Visit Diagnosis:  Tendinopathy of right gluteal region  Difficulty walking      Subjective Assessment - 11/01/15 0952    Subjective Patient reports she was able to ascend steps yesterday with "some pulling" but no sharp pain. Increased "pulling", more of an ache with descent.    Pertinent History L ankle fracture roughly 4 years ago.    Limitations Walking;Sitting   Patient Stated Goals Patient would like to begin water aerobics as well as participate in [employer].    Currently in Pain? No/denies       Manual Therapy  Long axis distraction x 3 bouts of 60" with breaks between, patient reports no change in symptoms with stair ascent before/after.   TherEx Single leg bridging on RLE with 5" holds x 8 repetitions. 3 sets. Initially increased pull noted, decreased with repetitions.  Single knee to chest with therapist overpressure 2 sets x 10 repetitions, patient reports significant decline on stair ascent/descent.  Stair ascent/descent education, x 3  rounds of 4 steps. Initially noted lateral trunk flexion on descent on her RLE to her R, decreased with cuing. Patient reports significant decrease in pain afterwards.  Side step ups - appropriate technique on 5 repetitions, however increased "pulling" noted with each repetition.                        PT Education - 11/01/15 1044    Education provided Yes   Education Details Educated patient on technique to come down on her toes, progression of HEP.    Person(s) Educated Patient   Methods Explanation;Demonstration;Handout   Comprehension Verbalized understanding;Returned demonstration             PT Long Term Goals - 10/16/15 1546    PT LONG TERM GOAL #1   Title Patient will report an LEFs score of greater than 60/80 to demonstrate improved tolerance for functional activities.    Baseline 38/80   Time 4   Period Weeks   Status New   PT LONG TERM GOAL #2   Title Patient will ascend and descend the 3 flights of stairs at her workspace with no increase in symptoms to return to functional tasks independently.    Baseline Unable to ascend steps.    Time 4   Period Weeks   Status New   PT LONG TERM GOAL #  3   Title Patient will report a worst pain of no more than 3/10 in the previous week to demonstrate improved tolerance for functional mobility    Baseline 8/10   Time 4   Period Weeks   Status New               Plan - 11/01/15 1045    Clinical Impression Statement Patient continues to reports decreased symptoms and ability to ascend steps with minimal pain now. Patient demonstrates decreased pain with descent with toe touch first, to maximize quadriceps force produced. Patient also reports decreased pain with single knee to chest stretch. She appears to be progressing well towards mobility goals at this time.    Pt will benefit from skilled therapeutic intervention in order to improve on the following deficits Abnormal gait;Pain   Rehab Potential Good    Clinical Impairments Affecting Rehab Potential Previous resolution of symptoms with PT.    PT Duration 4 weeks   PT Treatment/Interventions Cryotherapy;Therapeutic activities;Therapeutic exercise;Manual techniques;Taping;Gait training;Dry needling   PT Home Exercise Plan See patient instructions.    Consulted and Agree with Plan of Care Patient        Problem List There are no active problems to display for this patient.  Kerman Passey, PT, DPT    11/01/2015, 10:52 AM  [place] [address], [place], [address] Phone: [phone]   Fax:  [phone]  Name: KUMBA DIEGO MRN: [id number] Date of Birth: 1950/09/27

## 2015-11-06 ENCOUNTER — Ambulatory Visit: Payer: Medicare Other | Admitting: Physical Therapy

## 2015-11-06 DIAGNOSIS — R262 Difficulty in walking, not elsewhere classified: Secondary | ICD-10-CM

## 2015-11-06 DIAGNOSIS — M7601 Gluteal tendinitis, right hip: Secondary | ICD-10-CM

## 2015-11-06 DIAGNOSIS — M67951 Unspecified disorder of synovium and tendon, right thigh: Secondary | ICD-10-CM

## 2015-11-06 NOTE — Therapy (Signed)
[facility] [address], [place], [address] Phone: [phone]   Fax:  [phone]  Physical Therapy Treatment  Patient Details  Name: Abigail Nguyen MRN: [id number] Date of Birth: November 05, 1950 No Data Recorded  Encounter Date: 11/06/2015      PT End of Session - 11/06/15 1050    Visit Number 6   Number of Visits 9   Date for PT Re-Evaluation 11/20/15   PT Start Time 0948   PT Stop Time 1027   PT Time Calculation (min) 39 min   Activity Tolerance Patient tolerated treatment well   Behavior During Therapy [facility] for tasks assessed/performed      Past Medical History  Diagnosis Date  . Hypertension   . Thyroid disease     Past Surgical History  Procedure Laterality Date  . Cesarean section    . Tonsillectomy    . Appendectomy    . Cholecystectomy    . Ankle surgery    . Eye surgery      There were no vitals filed for this visit.  Visit Diagnosis:  Tendinopathy of right gluteal region  Difficulty walking      Subjective Assessment - 11/06/15 0954    Subjective Patient reports she was able to participate more over the weekend. She reports she has been able to ascend/descend steps with less pain. Yesterday she had a flare up after walking up the hill and ascending the steps.    Pertinent History L ankle fracture roughly 4 years ago.    Limitations Walking;Sitting   Patient Stated Goals Patient would like to begin water aerobics as well as participate in [employer].    Currently in Pain? No/denies      TherEx  Piriformis stretching with 1 minute hold x 3 bouts with no increase in pain and stretching sensation over painful region Piriformis isometric contraction x 5" for 10 repetitions for 3 sets Side lunges x 8 for 3 sets onto blue foam pad  Single leg deadlifts x10 repetitions for 3 sets with appropriate loading of hip musculature bilaterally  **Able to ascend/descend steps with  minimal use of hand rail and minimal increase in symptoms   IR and lateral distraction mobilizations with car belt x 30" for 2 bouts.                            PT Education - 11/06/15 1049    Education provided Yes   Education Details Walking up the hill or the steps, try not to include both. Progression of home program.    Person(s) Educated Patient   Methods Explanation;Demonstration;Handout   Comprehension Verbalized understanding;Returned demonstration             PT Long Term Goals - 10/16/15 1546    PT LONG TERM GOAL #1   Title Patient will report an LEFs score of greater than 60/80 to demonstrate improved tolerance for functional activities.    Baseline 38/80   Time 4   Period Weeks   Status New   PT LONG TERM GOAL #2   Title Patient will ascend and descend the 3 flights of stairs at her workspace with no increase in symptoms to return to functional tasks independently.    Baseline Unable to ascend steps.    Time 4   Period Weeks   Status New   PT LONG TERM GOAL #3   Title Patient will  report a worst pain of no more than 3/10 in the previous week to demonstrate improved tolerance for functional mobility    Baseline 8/10   Time 4   Period Weeks   Status New               Plan - 11/06/15 1050    Clinical Impression Statement Patient reports a flare up yesterday, however she has made excellent progress otherwise. She has been able to participate in her social life as well as work related activities. She has been progressing well with ther-ex at higher loading intensities and would benefit from additional skilled PT services to address her pain and strength deficit.    Pt will benefit from skilled therapeutic intervention in order to improve on the following deficits Abnormal gait;Pain   Rehab Potential Good   Clinical Impairments Affecting Rehab Potential Previous resolution of symptoms with PT.    PT Frequency 2x / week   PT Duration 4  weeks   PT Treatment/Interventions Cryotherapy;Therapeutic activities;Therapeutic exercise;Manual techniques;Taping;Gait training;Dry needling   PT Next Visit Plan Progress manual techniques as tolerated, isometric and active posterior hip strengthening as tolerated.    PT Home Exercise Plan See patient instructions.    Consulted and Agree with Plan of Care Patient        Problem List There are no active problems to display for this patient.  Kerman Passey, PT, DPT    11/06/2015, 10:56 AM  [place] [facility] [address] [place], [address] Phone: [phone]   Fax:  [phone]  Name: Abigail Nguyen MRN: [id number] Date of Birth: 28-Nov-1950

## 2015-11-08 ENCOUNTER — Ambulatory Visit: Payer: Medicare Other | Admitting: Physical Therapy

## 2015-11-08 DIAGNOSIS — M7601 Gluteal tendinitis, right hip: Secondary | ICD-10-CM

## 2015-11-08 DIAGNOSIS — R262 Difficulty in walking, not elsewhere classified: Secondary | ICD-10-CM

## 2015-11-08 DIAGNOSIS — M67951 Unspecified disorder of synovium and tendon, right thigh: Secondary | ICD-10-CM

## 2015-11-08 NOTE — Therapy (Signed)
[facility] [address] [place], [address] Phone: [phone]   Fax:  [phone]  Physical Therapy Treatment  Patient Details  Name: Abigail Nguyen MRN: [id number] Date of Birth: 09-01-1951 No Data Recorded  Encounter Date: 11/08/2015      PT End of Session - 11/08/15 1416    Visit Number 7   Number of Visits 9   Date for PT Re-Evaluation 11/20/15   PT Start Time 0945   PT Stop Time 1026   PT Time Calculation (min) 41 min   Activity Tolerance Patient tolerated treatment well   Behavior During Therapy [facility] for tasks assessed/performed      Past Medical History  Diagnosis Date  . Hypertension   . Thyroid disease     Past Surgical History  Procedure Laterality Date  . Cesarean section    . Tonsillectomy    . Appendectomy    . Cholecystectomy    . Ankle surgery    . Eye surgery      There were no vitals filed for this visit.  Visit Diagnosis:  Tendinopathy of right gluteal region  Difficulty walking      Subjective Assessment - 11/08/15 0950    Subjective Patient reports mild soreness after last session, which dissipated quickly. She was able to go up and down the hill to work twice yesterday, she felt more of an ache which didn't last long. Somewhat stiff this morning, which resolved with moving around.    Pertinent History L ankle fracture roughly 4 years ago.    Limitations Walking;Sitting   Patient Stated Goals Patient would like to begin water aerobics as well as participate in [employer].    Currently in Pain? No/denies      Side stepping x 12 with yellow t-band (easy), progressed to red t-band x 12 bilaterally (appropriately challenging)   Sit to stand x 8 with 3# DB (easy), x 8 with 5# DB for 2 sets   Side lunges to blue foam pad x 12 bilaterally with no HHA, progressed to yellow t-band (no reports of pain) x 8 for 2 sets   Standing hip abductions 2 sets x 10  repetitions with yellow t-band   Double knee to chest x 10 with 3" holds 9does not get area as well as piriformis stretch)  Piriformis stretch x 8 on RLE (felt appropriate stretch - 3" holds)                            PT Education - 11/08/15 1415    Education provided Yes   Education Details Attempt steps using LUE for support, progression of HEP.    Person(s) Educated Patient   Methods Explanation;Demonstration;Handout   Comprehension Verbalized understanding;Returned demonstration             PT Long Term Goals - 10/16/15 1546    PT LONG TERM GOAL #1   Title Patient will report an LEFs score of greater than 60/80 to demonstrate improved tolerance for functional activities.    Baseline 38/80   Time 4   Period Weeks   Status New   PT LONG TERM GOAL #2   Title Patient will ascend and descend the 3 flights of stairs at her workspace with no increase in symptoms to return to functional tasks independently.    Baseline Unable to ascend steps.    Time 4   Period Weeks  Status New   PT LONG TERM GOAL #3   Title Patient will report a worst pain of no more than 3/10 in the previous week to demonstrate improved tolerance for functional mobility    Baseline 8/10   Time 4   Period Weeks   Status New               Plan - 11/08/15 1416    Clinical Impression Statement Patient reports ability to tolerate hills yesterday with minor increase in symptoms. She appears to be past the acute pain phase, and now would benefit most from strengthening of hip abductors/ERs. She reports no pain with sit to stand anymore, and "just an ache" with increased activity. She is progressing well towards established mobility goals.    Pt will benefit from skilled therapeutic intervention in order to improve on the following deficits Abnormal gait;Pain   Rehab Potential Good   Clinical Impairments Affecting Rehab Potential Previous resolution of symptoms with PT.    PT  Frequency 2x / week   PT Duration 4 weeks   PT Treatment/Interventions Cryotherapy;Therapeutic activities;Therapeutic exercise;Manual techniques;Taping;Gait training;Dry needling   PT Next Visit Plan Progress manual techniques as tolerated, isometric and active posterior hip strengthening as tolerated.    PT Home Exercise Plan See patient instructions.    Consulted and Agree with Plan of Care Patient        Problem List There are no active problems to display for this patient.  Kerman Passey, PT, DPT    11/08/2015, 2:20 PM  [facility] [address] [place], [address] Phone: [phone]   Fax:  [phone]  Name: Abigail Nguyen MRN: [id number] Date of Birth: Sep 26, 1950

## 2015-11-13 ENCOUNTER — Ambulatory Visit: Payer: Medicare Other | Admitting: Physical Therapy

## 2015-11-15 ENCOUNTER — Ambulatory Visit: Payer: Medicare Other | Attending: Internal Medicine | Admitting: Physical Therapy

## 2015-11-15 ENCOUNTER — Ambulatory Visit: Payer: Medicare Other

## 2015-11-15 DIAGNOSIS — R262 Difficulty in walking, not elsewhere classified: Secondary | ICD-10-CM | POA: Diagnosis present

## 2015-11-15 DIAGNOSIS — M67951 Unspecified disorder of synovium and tendon, right thigh: Secondary | ICD-10-CM

## 2015-11-15 DIAGNOSIS — M7601 Gluteal tendinitis, right hip: Secondary | ICD-10-CM | POA: Diagnosis present

## 2015-11-15 NOTE — Therapy (Signed)
[facility] [address] [place], [address] Phone: [phone]   Fax:  [phone]  Physical Therapy Treatment  Patient Details  Name: ANIEYA PLIEGO MRN: [id number] Date of Birth: [birth year] No Data Recorded  Encounter Date: 11/15/2015      PT End of Session - 11/15/15 1043    Visit Number 8   Number of Visits 9   Date for PT Re-Evaluation 11/20/15   PT Start Time 0950   PT Stop Time 1033   PT Time Calculation (min) 43 min   Activity Tolerance Patient tolerated treatment well   Behavior During Therapy [facility] for tasks assessed/performed      Past Medical History  Diagnosis Date  . Hypertension   . Thyroid disease     Past Surgical History  Procedure Laterality Date  . Cesarean section    . Tonsillectomy    . Appendectomy    . Cholecystectomy    . Ankle surgery    . Eye surgery      There were no vitals filed for this visit.  Visit Diagnosis:  Tendinopathy of right gluteal region  Difficulty walking      Subjective Assessment - 11/15/15 0959    Subjective Patient reports she did a lot of walking on Saturday at [facility], including prolonged uphills and steps. She reports she had intense pain Saturday evening, into Sunday and has had increases and decreases since Saturday. Felt much better Monday and since that time it has been on and off.    Pertinent History L ankle fracture roughly 4 years ago.    Limitations Walking;Sitting   Patient Stated Goals Patient would like to begin water aerobics as well as participate in [employer].    Currently in Pain? Yes   Pain Score 7    Pain Location Hip   Pain Orientation Right;Posterior   Pain Descriptors / Indicators Aching   Pain Type Chronic pain   Pain Radiating Towards R IT band    Pain Onset More than a month ago   Pain Frequency Intermittent   Aggravating Factors  Going up steps/incline    Pain Relieving Factors Icing, stretching has been  helpful.         Prone IR with grade IV oscillations over trigger point in posterior R hip 2 bouts x 30 seconds, immediately followed by bouts of grade IV mobilizations x 30" over trigger point.   Ely's test stretching x 10 repetitions for 2 seconds   Patient reported decrease in pain with ambulation from 7/10 to 3/10 with ambulation.   Repeated Prone IR with grade IV oscillations over trigger point in posterior R hip 2 bouts x 30 seconds, immediately followed by bouts of grade IV mobilizations x 30" over trigger point.   During ambulation 1-2/10 on this bout.   Repeated Prone IR with grade IV oscillations over trigger point in posterior R hip 2 bouts x 30 seconds, immediately followed by bouts of grade IV mobilizations x 30" over trigger point.   Afterwards 1/10 pain during ambulation.                           PT Education - 11/15/15 1039    Education provided Yes   Education Details To limit activity, trigger point work on R hip when symptomatic.    Person(s) Educated Patient   Methods Explanation;Demonstration;Handout   Comprehension Verbalized understanding;Returned demonstration  PT Long Term Goals - 10/16/15 1546    PT LONG TERM GOAL #1   Title Patient will report an LEFs score of greater than 60/80 to demonstrate improved tolerance for functional activities.    Baseline 38/80   Time 4   Period Weeks   Status New   PT LONG TERM GOAL #2   Title Patient will ascend and descend the 3 flights of stairs at her workspace with no increase in symptoms to return to functional tasks independently.    Baseline Unable to ascend steps.    Time 4   Period Weeks   Status New   PT LONG TERM GOAL #3   Title Patient will report a worst pain of no more than 3/10 in the previous week to demonstrate improved tolerance for functional mobility    Baseline 8/10   Time 4   Period Weeks   Status New               Plan - 11/15/15 1044     Clinical Impression Statement Patient reports increase in symptoms after increased activity levels which have been intermittently increasing and decreasing throughout the week. She responded very well to trigger point ischemic pressure with R hip in IR to stretch piriformis today, reporting decrease from 7 to 1/10 pain with ambulation. Patient had been progressing well with PT, able to complete prolonged distance ambulation with no pain prior to this flare up. Will need to progress to strengthening and load tolerance as able in follow up sessions.    Pt will benefit from skilled therapeutic intervention in order to improve on the following deficits Abnormal gait;Pain   Rehab Potential Good   Clinical Impairments Affecting Rehab Potential Previous resolution of symptoms with PT.    PT Frequency 2x / week   PT Duration 4 weeks   PT Treatment/Interventions Cryotherapy;Therapeutic activities;Therapeutic exercise;Manual techniques;Taping;Gait training;Dry needling   PT Next Visit Plan Progress manual techniques as tolerated, isometric and active posterior hip strengthening as tolerated. Will need to perform re-evaluation and re-certification if needed.    PT Home Exercise Plan See patient instructions.    Consulted and Agree with Plan of Care Patient        Problem List There are no active problems to display for this patient.  Kerman Passey, PT, DPT    11/15/2015, 10:48 AM  [facility] [address] [place], [address] Phone: [phone]   Fax:  [phone]  Name: KANITHA GRANVILLE MRN: [id number] Date of Birth: [birth year]

## 2015-11-20 ENCOUNTER — Ambulatory Visit: Payer: Medicare Other | Admitting: Physical Therapy

## 2015-11-20 ENCOUNTER — Ambulatory Visit: Payer: Medicare Other | Admitting: Occupational Therapy

## 2015-11-20 DIAGNOSIS — R262 Difficulty in walking, not elsewhere classified: Secondary | ICD-10-CM

## 2015-11-20 DIAGNOSIS — M7601 Gluteal tendinitis, right hip: Secondary | ICD-10-CM | POA: Diagnosis not present

## 2015-11-20 DIAGNOSIS — M67951 Unspecified disorder of synovium and tendon, right thigh: Secondary | ICD-10-CM

## 2015-11-20 NOTE — Therapy (Signed)
[facility] [address], [place], [address] Phone: [phone]   Fax:  [phone]  Physical Therapy Treatment  Patient Details  Name: Abigail Nguyen MRN: [id number] Date of Birth: [birth year] No Data Recorded  Encounter Date: 11/20/2015      PT End of Session - 11/20/15 1221    Visit Number 9   Number of Visits 12   Date for PT Re-Evaluation 12/11/15   PT Start Time 1101   PT Stop Time 1145   PT Time Calculation (min) 44 min   Activity Tolerance Patient tolerated treatment well   Behavior During Therapy [facility] for tasks assessed/performed      Past Medical History  Diagnosis Date  . Hypertension   . Thyroid disease     Past Surgical History  Procedure Laterality Date  . Cesarean section    . Tonsillectomy    . Appendectomy    . Cholecystectomy    . Ankle surgery    . Eye surgery      There were no vitals filed for this visit.  Visit Diagnosis:  Tendinopathy of right gluteal region - Plan: PT plan of care cert/re-cert  Difficulty walking - Plan: PT plan of care cert/re-cert      Subjective Assessment - 11/20/15 1107    Subjective Patient reports she feels much better in general, she continues to have some stiffness and pain in the mornings. She reports going up hill has improved while steps still bother her.    Pertinent History L ankle fracture roughly 4 years ago.    Limitations Walking;Sitting   Patient Stated Goals Patient would like to begin water aerobics as well as participate in [employer].    Currently in Pain? Yes   Pain Score 2   With walking   Pain Location Hip   Pain Orientation Right;Posterior   Pain Descriptors / Indicators Aching   Pain Type Chronic pain   Pain Radiating Towards R IT band    Pain Onset More than a month ago   Pain Frequency Intermittent   Aggravating Factors  Going up steps (incline not as bad now)    Pain Relieving Factors Stretching, trigger  point release with IR of hip.       TherEx  Standing hip abductions with HHA x 8 with 2" holds bilaterally (rated as medium) appropriate activation.  Sit to stand with bodyweight x 10 (demonstrated valgus bilaterally), with 5# DB and yellow t-band x 10 repetitions for 2 sets  Standing single leg deadlifts x 10 bilaterally with HHA for 3 sets. ( continues to have difficulty due to strength/balance deficits)  Leg press x 10 at 35, 45, 55 pounds -- no pain, 55# appropriately challenging. Educated patient to complete at gym such that 10 repetitions were beginning to be challenging.  Ascending/descending steps x 5 rounds, noted vaulting on RLE, had patient use railing on L side which reduced her symptoms. Educated to complete this way at work.  Single leg step downs for specificity (from step sill painful) -- (from a plastic step x5 with eccentric control for 3 sets -- less pain noted)   Side stepping with yellow t-band x 10 repetitions for 2 sets.                            PT Education - 11/20/15 1222    Education provided Yes   Education Details Home exercise  program, use of hand rail on L side to provide assistance for descending steps.    Person(s) Educated Patient   Methods Explanation;Demonstration;Handout   Comprehension Verbalized understanding;Returned demonstration             PT Long Term Goals - 2015/12/13 1110    PT LONG TERM GOAL #1   Title Patient will report an LEFs score of greater than 60/80 to demonstrate improved tolerance for functional activities.    Baseline 38/80   Time 4   Period Weeks   Status Achieved   PT LONG TERM GOAL #2   Title Patient will ascend and descend the 3 flights of stairs at her workspace with no increase in symptoms to return to functional tasks independently.    Baseline Unable to ascend steps.    Time 4   Period Weeks   Status Partially Met   PT LONG TERM GOAL #3   Title Patient will report a worst pain of no more  than 3/10 in the previous week to demonstrate improved tolerance for functional mobility    Baseline 8/10 (5/6 last Friday, has reduced since)    Time 4   Period Weeks   Status Partially Met               Plan - 12-13-15 1218    Clinical Impression Statement Patient continues to report decrease in pain so long as she does not have sharp increase in activity. She reports significant decrease in pain from last session and tolerates strengthening routine well today. Patient provided with strengthening program for HEP to incrrease tolerance for eccentric single leg gluteal demands. Patient would benefit from additional skilled PT services to address the above deficits for return to recreational/functional activities.    Pt will benefit from skilled therapeutic intervention in order to improve on the following deficits Abnormal gait;Pain   Rehab Potential Good   Clinical Impairments Affecting Rehab Potential Previous resolution of symptoms with PT.    PT Frequency 1x / week   PT Duration 3 weeks   PT Treatment/Interventions Cryotherapy;Therapeutic activities;Therapeutic exercise;Manual techniques;Taping;Gait training;Dry needling   PT Next Visit Plan Hip abductor/ER strengthening in functional patterns.    PT Home Exercise Plan See patient instructions.    Consulted and Agree with Plan of Care Patient          G-Codes - December 13, 2015 1159    Functional Assessment Tool Used Squat, LEFS, Clinical judgement    Functional Limitation Mobility: Walking and moving around   Mobility: Walking and Moving Around Current Status [phone]) At least 1 percent but less than 20 percent impaired, limited or restricted   Mobility: Walking and Moving Around Goal Status [phone]) At least 1 percent but less than 20 percent impaired, limited or restricted      Problem List There are no active problems to display for this patient.  Kerman Passey, PT, DPT    12-13-2015, 12:26 PM  [facility] [address], [place], [address] Phone: [phone]   Fax:  [phone]  Name: Abigail Nguyen MRN: [id number] Date of Birth: 1950/09/28

## 2015-11-20 NOTE — Patient Instructions (Signed)
All exercises provided were adapted from [email or web address]. Patient was provided a written handout with pictures as described. Any additional cues were manually entered in to handout and copied in to this document.  WALKER HIP ABDUCTION - FWW ABDUCTIONS  While standing up using a walker, raise your leg out to the side. Keep your knee straight and maintain your toes pointed forward the entire time.   Use your arms for support and balance and have a chair behind you for safety.  ** Complete every other day**    SIT TO STAND - NO HANDS  Start by sitting in a chair. Next, raise up to standing without using your hands for support.    ** With your 5# weight**   Single Leg Deadlift  Stance leg should have slight knee bend.  Keep your back and kicking leg straight while keeping your gluts and core tight.  Then bend forward on your stance leg hip making sure you feel your gluts and hamstrings working.  Keep a straight line from your shoulder to your heel.  It helps to keep your gluts tight by pointing your toes as you squeeze your gluts.  Pause at the bottom then return nearly to the top. Repeat.  Start without weight then you can progress to a dumbbell or band/pulley resistance.   Side Step Ups  Place affected extremity onto step. Step up and touch step with other foot.     **Every other day from a smaller step -- should be not painful**

## 2015-11-22 ENCOUNTER — Encounter: Payer: Medicare Other | Admitting: Occupational Therapy

## 2015-11-27 ENCOUNTER — Ambulatory Visit: Payer: Medicare Other | Admitting: Physical Therapy

## 2015-11-27 DIAGNOSIS — M7601 Gluteal tendinitis, right hip: Secondary | ICD-10-CM | POA: Diagnosis not present

## 2015-11-27 DIAGNOSIS — R262 Difficulty in walking, not elsewhere classified: Secondary | ICD-10-CM

## 2015-11-27 DIAGNOSIS — M67951 Unspecified disorder of synovium and tendon, right thigh: Secondary | ICD-10-CM

## 2015-11-27 NOTE — Therapy (Signed)
[facility] [address] [place], [address] Phone: [phone]   Fax:  [phone]  Physical Therapy Treatment  Patient Details  Name: Abigail Nguyen MRN: [id number] Date of Birth: [birth year] No Data Recorded  Encounter Date: 11/27/2015      PT End of Session - 11/27/15 0928    Visit Number 10   Number of Visits 12   Date for PT Re-Evaluation 12/11/15   PT Start Time 0844   PT Stop Time 0925   PT Time Calculation (min) 41 min   Activity Tolerance Patient tolerated treatment well   Behavior During Therapy [facility] for tasks assessed/performed      Past Medical History  Diagnosis Date  . Hypertension   . Thyroid disease     Past Surgical History  Procedure Laterality Date  . Cesarean section    . Tonsillectomy    . Appendectomy    . Cholecystectomy    . Ankle surgery    . Eye surgery      There were no vitals filed for this visit.  Visit Diagnosis:  Tendinopathy of right gluteal region  Difficulty walking      Subjective Assessment - 11/27/15 0846    Subjective Patient continues to report an improvement in her symptoms, she was able to ascend/descend the steps and go up/down the hill to work yesterday with no increase in symptoms. She reports she has made great progress.   Pertinent History L ankle fracture roughly 4 years ago.    Limitations Walking;Sitting   Patient Stated Goals Patient would like to begin water aerobics as well as participate in [employer].    Currently in Pain? No/denies   Pain Onset More than a month ago     TherEx  Bird Dogs x 8 repetitions per side (much more challenging when RLE extended with LUE) Cuing initially for RLE only, progressed to LUE as well with notable challenge to maintain minimal rotary disruptions.   Modified side planks (knees on table, lifting hips off) x 8 for 2 sets bilaterally (noted to be challenging even with use of UEs).   BOSU Step  ups with 1 HHA x 5 bilaterally for 2 sets (mild pull on RLE, no pain)   Standing hip abductions x 10 bilaterally for 2 sets on blue foam pad (cued to hold 1-3 seconds as tolerated)   SLDL on Blue foam pad x 8 for 2 sets bilaterally (no pain reported.)                            PT Education - 11/27/15 0927    Education provided Yes   Education Details Program for the gym, go to the gym tomorrow and report to PT on thursday for discharge instructions.    Person(s) Educated Patient   Methods Explanation;Demonstration;Handout   Comprehension Verbalized understanding;Returned demonstration             PT Long Term Goals - 11/20/15 1110    PT LONG TERM GOAL #1   Title Patient will report an LEFs score of greater than 60/80 to demonstrate improved tolerance for functional activities.    Baseline 38/80   Time 4   Period Weeks   Status Achieved   PT LONG TERM GOAL #2   Title Patient will ascend and descend the 3 flights of stairs at her workspace with no increase in symptoms to return to functional  tasks independently.    Baseline Unable to ascend steps.    Time 4   Period Weeks   Status Partially Met   PT LONG TERM GOAL #3   Title Patient will report a worst pain of no more than 3/10 in the previous week to demonstrate improved tolerance for functional mobility    Baseline 8/10 (5/6 last Friday, has reduced since)    Time 4   Period Weeks   Status Partially Met               Plan - 11/27/15 0929    Clinical Impression Statement Patient reports she has now returned to her previous level of activities without an increase in symptoms. She reports no dull ache or sharp pains anymore, and has achieved her PT related goals. She was provided with a core strengthening and dynamic hip stabilization program today for maintenance, she was instructed for one further session to monitor tolerance for HEP provided.    Pt will benefit from skilled therapeutic  intervention in order to improve on the following deficits Abnormal gait;Pain   Rehab Potential Good   Clinical Impairments Affecting Rehab Potential Previous resolution of symptoms with PT.    PT Frequency 1x / week   PT Duration 3 weeks   PT Treatment/Interventions Cryotherapy;Therapeutic activities;Therapeutic exercise;Manual techniques;Taping;Gait training;Dry needling   PT Next Visit Plan Discharge instructions for maintenance program with core strengthening and LE strengthening with emphasis on hip/gluteal strengthening.    PT Home Exercise Plan Bird dog, modified side plank, single leg deadlifts on non-compliant surface, leg press.    Consulted and Agree with Plan of Care Patient        Problem List There are no active problems to display for this patient.  Kerman Passey, PT, DPT    11/27/2015, 9:35 AM  [facility] [address] [place], [address] Phone: [phone]   Fax:  [phone]  Name: Abigail Nguyen MRN: [id number] Date of Birth: 01-14-1951

## 2015-11-29 ENCOUNTER — Ambulatory Visit: Payer: Medicare Other | Admitting: Physical Therapy

## 2015-11-29 DIAGNOSIS — M67951 Unspecified disorder of synovium and tendon, right thigh: Secondary | ICD-10-CM

## 2015-11-29 DIAGNOSIS — M7601 Gluteal tendinitis, right hip: Secondary | ICD-10-CM

## 2015-11-29 DIAGNOSIS — R262 Difficulty in walking, not elsewhere classified: Secondary | ICD-10-CM

## 2015-11-29 NOTE — Therapy (Signed)
[facility] [address] [place], [address] Phone: [phone]   Fax:  [phone]  Physical Therapy Treatment  Patient Details  Name: IVIONNA VERLEY MRN: [id number] Date of Birth: 1951/08/25 No Data Recorded  Encounter Date: 11/29/2015      PT End of Session - 11/29/15 1746    Visit Number 11   Number of Visits 12   Date for PT Re-Evaluation 12/11/15   PT Start Time 1716   PT Stop Time 1743   PT Time Calculation (min) 27 min   Activity Tolerance Patient tolerated treatment well   Behavior During Therapy [facility] for tasks assessed/performed      Past Medical History  Diagnosis Date  . Hypertension   . Thyroid disease     Past Surgical History  Procedure Laterality Date  . Cesarean section    . Tonsillectomy    . Appendectomy    . Cholecystectomy    . Ankle surgery    . Eye surgery      There were no vitals filed for this visit.  Visit Diagnosis:  Tendinopathy of right gluteal region  Difficulty walking      Subjective Assessment - 11/29/15 1722    Subjective Patient reports she attempted to go to the gym yesterday, however the gym was full and she was unable to go.    Pertinent History L ankle fracture roughly 4 years ago.    Limitations Walking;Sitting   Patient Stated Goals Patient would like to begin water aerobics as well as participate in [employer].    Currently in Pain? No/denies   Pain Onset More than a month ago     TherEx Bird dogs 2 sets x 10 repetitions per side. No pain, challenge noted in appropriate musculature (hip extensors, obliques)  Educated patient on exercise progressions, use of stair stepper (to increase gradually) as she has asked if she could use.  Educated patient on technique for reverse lunges with HHA x 10 repetitions bilaterally with cuing for decreased knee flexion on LLE when RLE fixed secondary to pulling/pain.   Educated patient to stretch  piriformis in sitting if she continues to have pain with sit to stand transitions.                             PT Education - 11/29/15 1744    Education provided Yes   Education Details Discharge HEP    Person(s) Educated Patient   Methods Explanation;Demonstration;Handout   Comprehension Verbalized understanding;Returned demonstration             PT Long Term Goals - 11/20/15 1110    PT LONG TERM GOAL #1   Title Patient will report an LEFs score of greater than 60/80 to demonstrate improved tolerance for functional activities.    Baseline 38/80   Time 4   Period Weeks   Status Achieved   PT LONG TERM GOAL #2   Title Patient will ascend and descend the 3 flights of stairs at her workspace with no increase in symptoms to return to functional tasks independently.    Baseline Unable to ascend steps.    Time 4   Period Weeks   Status Partially Met   PT LONG TERM GOAL #3   Title Patient will report a worst pain of no more than 3/10 in the previous week to demonstrate improved tolerance for functional mobility  Baseline 8/10 (5/6 last Friday, has reduced since)    Time 4   Period Weeks   Status Partially Met               Plan - 11/29/15 1746    Clinical Impression Statement Patient reports ability to compelte all ADLs now, minimal increase in pain. She has been given an HEP to target residual symptoms and she seems motivated to resume her gym routine. Patient appears appropriate for discharge with resolution of acute glute tendinopathy symptoms.    Pt will benefit from skilled therapeutic intervention in order to improve on the following deficits Abnormal gait;Pain   Rehab Potential Good   Clinical Impairments Affecting Rehab Potential Previous resolution of symptoms with PT.    PT Frequency 1x / week   PT Duration 3 weeks   PT Treatment/Interventions Cryotherapy;Therapeutic activities;Therapeutic exercise;Manual techniques;Taping;Gait  training;Dry needling   PT Next Visit Plan Discharge instructions for maintenance program with core strengthening and LE strengthening with emphasis on hip/gluteal strengthening.    PT Home Exercise Plan Bird dog, modified side plank, single leg deadlifts on non-compliant surface, leg press.    Consulted and Agree with Plan of Care Patient        Problem List There are no active problems to display for this patient.  Kerman Passey, PT, DPT    11/29/2015, 5:51 PM  [facility] [address] [place], [address] Phone: [phone]   Fax:  [phone]  Name: BAYLYN SICKLES MRN: [id number] Date of Birth: May 19, 1951

## 2015-12-31 ENCOUNTER — Encounter: Payer: Self-pay | Admitting: *Deleted

## 2015-12-31 ENCOUNTER — Ambulatory Visit
Admission: EM | Admit: 2015-12-31 | Discharge: 2015-12-31 | Disposition: A | Discharge status: Home / Self Care | Payer: Medicare Other | Attending: Family Medicine | Admitting: Family Medicine

## 2015-12-31 DIAGNOSIS — R05 Cough: Secondary | ICD-10-CM

## 2015-12-31 DIAGNOSIS — R059 Cough, unspecified: Secondary | ICD-10-CM

## 2015-12-31 LAB — RAPID INFLUENZA A&B ANTIGENS: Influenza B (ARMC): NEGATIVE

## 2015-12-31 LAB — RAPID INFLUENZA A&B ANTIGENS (ARMC ONLY): INFLUENZA A (ARMC): NEGATIVE

## 2015-12-31 MED ORDER — HYDROCOD POLST-CPM POLST ER 10-8 MG/5ML PO SUER: 5.0000 mL | Freq: Two times a day (BID) | ORAL | Status: DC | PRN

## 2015-12-31 MED ORDER — ALBUTEROL SULFATE HFA 108 (90 BASE) MCG/ACT IN AERS: 1.0000 | INHALATION_SPRAY | Freq: Four times a day (QID) | RESPIRATORY_TRACT | Status: DC | PRN

## 2015-12-31 NOTE — ED Notes (Signed)
Non-productive cough, chest/abd pain/soreness with coughing, and gen weakness x1 week. Intermittent fever at onset.

## 2015-12-31 NOTE — ED Provider Notes (Signed)
CSN: [id number]     Arrival date & time 12/31/15  0957 History   First MD Initiated Contact with Patient 12/31/15 1138     Chief Complaint  Patient presents with  . Cough  . Pleurisy  . Weakness   (Consider location/radiation/quality/duration/timing/severity/associated sxs/prior Treatment) HPI Comments: 65 yo female with a c/o cough, shortness of breath, weakness for the last week. States was seen at another urgent care 4 days ago, had a chest x-ray, was told she had bronchitis and given antibiotic minocycline. Patient states still coughing and wondering if she has the flu (states was not tested for the flu). Patient does not smoke but does have years of second hand cigarette smoke exposure.   The history is provided by the patient.    Past Medical History  Diagnosis Date  . Hypertension   . Thyroid disease    Past Surgical History  Procedure Laterality Date  . Cesarean section    . Tonsillectomy    . Appendectomy    . Cholecystectomy    . Ankle surgery    . Eye surgery     Family History  Problem Relation Age of Onset  . Cancer Mother   . Coronary artery disease Father    Social History  Substance Use Topics  . Smoking status: Never Smoker   . Smokeless tobacco: None  . Alcohol Use: Yes     Comment: rarely   OB History    No data available     Review of Systems  Allergies  Compazine and Morphine and related  Home Medications   Prior to Admission medications   Medication Sig Start Date End Date Taking? Authorizing Provider  levothyroxine (SYNTHROID, LEVOTHROID) 25 MCG tablet Take 25 mcg by mouth daily before breakfast.   Yes Historical Provider, MD  lisinopril (PRINIVIL,ZESTRIL) 10 MG tablet Take 10 mg by mouth daily.   Yes Historical Provider, MD  minocycline (DYNACIN) 100 MG tablet Take 100 mg by mouth 2 (two) times daily.   Yes Historical Provider, MD  albuterol (PROVENTIL HFA;VENTOLIN HFA) 108 (90 Base) MCG/ACT inhaler Inhale 1-2 puffs into the lungs every  6 (six) hours as needed for wheezing or shortness of breath. 12/31/15   Norval Gable, MD  azithromycin (ZITHROMAX Z-PAK) 250 MG tablet Use as per package instructions 08/30/15   Lorin Picket, PA-C  chlorpheniramine-HYDROcodone [facility]) 10-8 MG/5ML SUER Take 5 mLs by mouth every 12 (twelve) hours as needed. 12/31/15   Norval Gable, MD  ciprofloxacin (CIPRO) 500 MG tablet Take 1 tablet (500 mg total) by mouth 2 (two) times daily. 07/05/15   Lorin Picket, PA-C  nitrofurantoin, macrocrystal-monohydrate, (MACROBID) 100 MG capsule Take 1 capsule (100 mg total) by mouth 2 (two) times daily. 06/28/15   Marylene Land, NP   Meds Ordered and Administered this Visit  Medications - No data to display  BP 131/80 mmHg  Pulse 70  Temp(Src) 98.5 F (36.9 C) (Oral)  Resp 16  Ht 5\' 3"  (1.6 m)  Wt 189 lb (85.73 kg)  BMI 33.49 kg/m2  SpO2 98% No data found.   Physical Exam  Constitutional: She appears well-developed and well-nourished. No distress.  HENT:  Head: Normocephalic and atraumatic.  Right Ear: Tympanic membrane, external ear and ear canal normal.  Left Ear: Tympanic membrane, external ear and ear canal normal.  Nose: No mucosal edema, rhinorrhea, nose lacerations, sinus tenderness, nasal deformity, septal deviation or nasal septal hematoma. No epistaxis.  No foreign bodies.  Mouth/Throat: Uvula  is midline, oropharynx is clear and moist and mucous membranes are normal. No oropharyngeal exudate, posterior oropharyngeal edema or posterior oropharyngeal erythema.  Eyes: Conjunctivae and EOM are normal. Pupils are equal, round, and reactive to light. Right eye exhibits no discharge. Left eye exhibits no discharge. No scleral icterus.  Neck: Normal range of motion. Neck supple. No thyromegaly present.  Cardiovascular: Normal rate, regular rhythm and normal heart sounds.   Pulmonary/Chest: Effort normal. No respiratory distress. She has wheezes (few, mild). She has no rales.   Diffuse rhonchi  Lymphadenopathy:    She has no cervical adenopathy.  Skin: She is not diaphoretic.  Nursing note and vitals reviewed.   ED Course  Procedures (including critical care time)  Labs Review Labs Reviewed  RAPID INFLUENZA A&B ANTIGENS ([place])    Imaging Review No results found.   Visual Acuity Review  Right Eye Distance:   Left Eye Distance:   Bilateral Distance:    Right Eye Near:   Left Eye Near:    Bilateral Near:         MDM   1. Cough    Discharge Medication List as of 12/31/2015 11:47 AM    START taking these medications   Details  albuterol (PROVENTIL HFA;VENTOLIN HFA) 108 (90 Base) MCG/ACT inhaler Inhale 1-2 puffs into the lungs every 6 (six) hours as needed for wheezing or shortness of breath., Starting 12/31/2015, Until Discontinued, Normal    chlorpheniramine-HYDROcodone (TUSSIONEX PENNKINETIC ER) 10-8 MG/5ML SUER Take 5 mLs by mouth every 12 (twelve) hours as needed., Starting 12/31/2015, Until Discontinued, Normal       1. Lab results and diagnosis reviewed with patient 2. rx as per orders above; reviewed possible side effects, interactions, risks and benefits; continue and finish current antibiotic (minocycline) 3. Recommend supportive treatment with rest, increased fluids 4. Follow-up prn if symptoms worsen or don't improve   Norval Gable, MD 12/31/15 1158

## 2016-06-08 ENCOUNTER — Encounter: Payer: Self-pay | Admitting: Emergency Medicine

## 2016-06-08 ENCOUNTER — Emergency Department
Admission: EM | Admit: 2016-06-08 | Discharge: 2016-06-09 | Disposition: A | Discharge status: Home / Self Care | Payer: Medicare Other | Attending: Emergency Medicine | Admitting: Emergency Medicine

## 2016-06-08 DIAGNOSIS — Z79899 Other long term (current) drug therapy: Secondary | ICD-10-CM | POA: Diagnosis not present

## 2016-06-08 DIAGNOSIS — R51 Headache: Secondary | ICD-10-CM | POA: Diagnosis present

## 2016-06-08 DIAGNOSIS — R002 Palpitations: Secondary | ICD-10-CM | POA: Diagnosis not present

## 2016-06-08 DIAGNOSIS — I10 Essential (primary) hypertension: Secondary | ICD-10-CM | POA: Insufficient documentation

## 2016-06-08 LAB — CBC
HCT: 35.1 % (ref 35.0–47.0)
Hemoglobin: 12.4 g/dL (ref 12.0–16.0)
MCH: 30.6 pg (ref 26.0–34.0)
MCHC: 35.4 g/dL (ref 32.0–36.0)
MCV: 86.5 fL (ref 80.0–100.0)
PLATELETS: 312 10*3/uL (ref 150–440)
RBC: 4.06 MIL/uL (ref 3.80–5.20)
RDW: 14.2 % (ref 11.5–14.5)
WBC: 6.5 10*3/uL (ref 3.6–11.0)

## 2016-06-08 LAB — BASIC METABOLIC PANEL
ANION GAP: 4 — AB (ref 5–15)
BUN: 13 mg/dL (ref 6–20)
CO2: 29 mmol/L (ref 22–32)
Calcium: 9.7 mg/dL (ref 8.9–10.3)
Chloride: 106 mmol/L (ref 101–111)
Creatinine, Ser: 1.03 mg/dL — ABNORMAL HIGH (ref 0.44–1.00)
GFR calc Af Amer: >60 mL/min (ref >60)
GFR, EST NON AFRICAN AMERICAN: 55 mL/min — AB (ref >60)
GLUCOSE: 100 mg/dL — AB (ref 65–99)
POTASSIUM: 3.8 mmol/L (ref 3.5–5.1)
SODIUM: 139 mmol/L (ref 135–145)

## 2016-06-08 MED ORDER — IBUPROFEN 600 MG PO TABS
600.0000 mg | ORAL_TABLET | Freq: Once | ORAL | Status: AC
  Administered 2016-06-09: 600 mg via ORAL
  Filled 2016-06-08: qty 1

## 2016-06-08 NOTE — ED Triage Notes (Addendum)
Pt presents to ED with hypertension and intermittent periods of tachycardia and palpitations. Pt states her pressure has been running high the past couple of days so she took her blood pressure at home tonight around 2010 with a resulting pressure of 208/105 and her HR 115. pt currently taking lisinopril. Pt states hx of the same but typically resolves on its own at home. Slight headache. Denies chest pain or palpations today.

## 2016-06-08 NOTE — ED Notes (Signed)
Patient reports notices yesterday that heart felt like it was "fluttering" she took her blood pressure and it was up.  Reports has continued to keep check on blood pressure and it going up and down and that she has continued to feel fluttering in chest off and on.

## 2016-06-09 ENCOUNTER — Emergency Department: Payer: Medicare Other

## 2016-06-09 LAB — TSH: TSH: 6.128 u[IU]/mL — ABNORMAL HIGH (ref 0.350–4.500)

## 2016-06-09 LAB — MAGNESIUM: Magnesium: 2.1 mg/dL (ref 1.7–2.4)

## 2016-06-09 LAB — TROPONIN I

## 2016-06-09 NOTE — ED Provider Notes (Signed)
[facility] Emergency Department Provider Note   ____________________________________________   First MD Initiated Contact with Patient 06/08/16 2343     (approximate)  I have reviewed the triage vital signs and the nursing notes.   HISTORY  Chief Complaint Palpitations; Hypertension; and Tachycardia   HPI Abigail Nguyen is a 65 y.o. female comes into the hospital today with elevated blood pressure and elevated heart rate. She reports that this is been going on for the last couple of days. The patient reports her highest blood pressure was 207/105 and her heart rate when she came in was 115.The patient reports that nothing is making her symptoms come on. She reports that she does take blood pressure medicine and she monitors her pressure periodically. The patient reports that she had a similar episode in June with fluttering in her chest. The patient reports that her BP was 200/105 at the time and her heart rate was 120. She reports that she was checked by EMS then but did not come in to the hospital. She reports that yesterday she felt some fluttering in her chest and her blood pressure was elevated. She continued to try to relax and take her blood pressure over and over and it did come down but when she woke up this morning it seemed to be elevated again. The patient did take 2 lisinopril this morning in an effort to help lower her blood pressure. She reports that she did have some mild headache but denies any chest pain. The patient rates her headache 3 out of 10 in intensity. She has no shortness of breath, abdominal pain, back pain. She is here for evaluation.   Past Medical History:  Diagnosis Date  . Hypertension   . Thyroid disease     There are no active problems to display for this patient.   Past Surgical History:  Procedure Laterality Date  . ANKLE SURGERY    . APPENDECTOMY    . CESAREAN SECTION    . CHOLECYSTECTOMY    . EYE SURGERY    .  TONSILLECTOMY      Prior to Admission medications   Medication Sig Start Date End Date Taking? Authorizing Provider  levothyroxine (SYNTHROID, LEVOTHROID) 25 MCG tablet Take 25 mcg by mouth daily before breakfast.   Yes Historical Provider, MD  lisinopril (PRINIVIL,ZESTRIL) 10 MG tablet Take 10 mg by mouth daily.   Yes Historical Provider, MD  albuterol (PROVENTIL HFA;VENTOLIN HFA) 108 (90 Base) MCG/ACT inhaler Inhale 1-2 puffs into the lungs every 6 (six) hours as needed for wheezing or shortness of breath. 12/31/15   Norval Gable, MD  azithromycin (ZITHROMAX Z-PAK) 250 MG tablet Use as per package instructions 08/30/15   Lorin Picket, PA-C  chlorpheniramine-HYDROcodone [facility] ER) 10-8 MG/5ML SUER Take 5 mLs by mouth every 12 (twelve) hours as needed. 12/31/15   Norval Gable, MD  ciprofloxacin (CIPRO) 500 MG tablet Take 1 tablet (500 mg total) by mouth 2 (two) times daily. 07/05/15   Lorin Picket, PA-C  minocycline (DYNACIN) 100 MG tablet Take 100 mg by mouth 2 (two) times daily.    Historical Provider, MD  nitrofurantoin, macrocrystal-monohydrate, (MACROBID) 100 MG capsule Take 1 capsule (100 mg total) by mouth 2 (two) times daily. 06/28/15   Marylene Land, NP    Allergies Compazine [prochlorperazine edisylate] and Morphine and related  Family History  Problem Relation Age of Onset  . Cancer Mother   . Coronary artery disease Father  Social History Social History  Substance Use Topics  . Smoking status: Never Smoker  . Smokeless tobacco: Never Used  . Alcohol use Yes     Comment: rarely    Review of Systems Constitutional: Hypertension Eyes: No visual changes. ENT: No sore throat. Cardiovascular: Palpitations Respiratory: Denies shortness of breath. Gastrointestinal: No abdominal pain.  No nausea, no vomiting.  No diarrhea.  No constipation. Genitourinary: Negative for dysuria. Musculoskeletal: Negative for back pain. Skin: Negative for  rash. Neurological: Headache  10-point ROS otherwise negative.  ____________________________________________   PHYSICAL EXAM:  VITAL SIGNS: ED Triage Vitals  Enc Vitals Group     BP 06/08/16 2057 (!) 174/93     Pulse Rate 06/08/16 2057 79     Resp 06/08/16 2057 18     Temp 06/08/16 2057 98.1 F (36.7 C)     Temp Source 06/08/16 2057 Oral     SpO2 06/08/16 2057 97 %     Weight 06/08/16 2053 180 lb (81.6 kg)     Height 06/08/16 2053 5\' 4"  (1.626 m)     Head Circumference --      Peak Flow --      Pain Score 06/08/16 2054 2     Pain Loc --      Pain Edu? --      Excl. in [place]? --     Constitutional: Alert and oriented. Well appearing and in no acute distress. Eyes: Conjunctivae are normal. PERRL. EOMI. Head: Atraumatic. Nose: No congestion/rhinnorhea. Mouth/Throat: Mucous membranes are moist.  Oropharynx non-erythematous. Cardiovascular: Normal rate, regular rhythm. Grossly normal heart sounds.  Good peripheral circulation. Respiratory: Normal respiratory effort.  No retractions. Lungs CTAB. Gastrointestinal: Soft and nontender. No distention. Positive bowel sounds Musculoskeletal: No lower extremity tenderness nor edema.  . Neurologic:  Normal speech and language. Cranial Nerves II through XII grossly intact with no focal motor or neuro deficits Skin:  Skin is warm, dry and intact.  Psychiatric: Mood and affect are normal.   ____________________________________________   LABS (all labs ordered are listed, but only abnormal results are displayed)  Labs Reviewed  BASIC METABOLIC PANEL - Abnormal; Notable for the following:       Result Value   Glucose, Bld 100 (*)    Creatinine, Ser 1.03 (*)    GFR calc non Af Amer 55 (*)    Anion gap 4 (*)    All other components within normal limits  TSH - Abnormal; Notable for the following:    TSH 6.128 (*)    All other components within normal limits  CBC  MAGNESIUM  TROPONIN I    ____________________________________________  EKG  ED ECG REPORT I, Loney Hering, the attending physician, personally viewed and interpreted this ECG.   Date: 06/08/2016  EKG Time: 2123  Rate: 77  Rhythm: normal sinus rhythm  Axis: normal  Intervals:none  ST&T Change: none  ____________________________________________  RADIOLOGY  CXR ____________________________________________   PROCEDURES  Procedure(s) performed: None  Procedures  Critical Care performed: No  ____________________________________________   INITIAL IMPRESSION / ASSESSMENT AND PLAN / ED COURSE  Pertinent labs & imaging results that were available during my care of the patient were reviewed by me and considered in my medical decision making (see chart for details).  This is a 65 year old female who comes into the hospital today with palpitations. She also has some elevated blood pressure. When I did arrive into the patient's room she was not having any palpitations and her blood pressure  was improving. The patient has a mild headache for which I gave her some ibuprofen. The patient will be reassessed.  Clinical Course   CXR:  1. Lungs remain grossly clear. 2. Unusual prominence of the right paratracheal soft tissues, with apparent mild rightward displacement of the distal trachea, of uncertain significance   The patient's blood work is unremarkable and her symptoms remain unchanged. She still not having any more palpitations and she is not having any elevated blood pressures. Although she does have this prominence on her CT scan she also has a moderate hiatal hernia. I feel that the patient should follow-up with her primary care physician for further evaluation of these symptoms and this prominent soft tissue. The patient otherwise has stable vital signs and no other complaints. She'll be discharged home. ____________________________________________   FINAL CLINICAL IMPRESSION(S) / ED  DIAGNOSES  Final diagnoses:  Palpitations  Essential hypertension      NEW MEDICATIONS STARTED DURING THIS VISIT:  Discharge Medication List as of 06/09/2016  2:50 AM       Note:  This document was prepared using Dragon voice recognition software and may include unintentional dictation errors.    Loney Hering, MD 06/09/16 [phone]

## 2016-09-30 ENCOUNTER — Ambulatory Visit
Admission: EM | Admit: 2016-09-30 | Discharge: 2016-09-30 | Disposition: A | Discharge status: Home / Self Care | Payer: Medicare Other | Attending: Emergency Medicine | Admitting: Emergency Medicine

## 2016-09-30 DIAGNOSIS — J069 Acute upper respiratory infection, unspecified: Secondary | ICD-10-CM | POA: Diagnosis not present

## 2016-09-30 LAB — RAPID INFLUENZA A&B ANTIGENS (ARMC ONLY): INFLUENZA B (ARMC): NEGATIVE

## 2016-09-30 LAB — RAPID INFLUENZA A&B ANTIGENS: Influenza A (ARMC): NEGATIVE

## 2016-09-30 MED ORDER — FLUTICASONE PROPIONATE 50 MCG/ACT NA SUSP: 2.0000 | Freq: Every day | NASAL | 0 refills | Status: DC

## 2016-09-30 MED ORDER — BENZONATATE 200 MG PO CAPS: ORAL_CAPSULE | ORAL | 0 refills | Status: DC

## 2016-09-30 MED ORDER — OSELTAMIVIR PHOSPHATE 75 MG PO CAPS: 75.0000 mg | ORAL_CAPSULE | Freq: Two times a day (BID) | ORAL | 0 refills | Status: DC

## 2016-09-30 NOTE — ED Triage Notes (Signed)
Patient complains of fever, cough, headaches, congestion, fatigue, body aches. Patient states that her symptoms started Sunday with worsening yesterday. Patient states that temperature was 101. Patient states that she took coricidin and mucinex without relief.

## 2016-09-30 NOTE — ED Provider Notes (Signed)
CSN: [id number]     Arrival date & time 09/30/16  [id number] History   First MD Initiated Contact with Patient 09/30/16 1014     Chief Complaint  Patient presents with  . Cough   (Consider location/radiation/quality/duration/timing/severity/associated sxs/prior Treatment) HPI  This a 66 year old female who presents with onset of fever cough headaches congestion and body aches that started 2 days ago. She had a fever today is 100.4 pulse 92 respirations 18 O2 sats 100% on room air. Flu shot earlier this year. Her cough occurs as paroxyms it is not constant. He has been taking some Mucinex which seems to help "break things up". She says several people in her office have been sick.     Past Medical History:  Diagnosis Date  . Hypertension   . Thyroid disease    Past Surgical History:  Procedure Laterality Date  . ANKLE SURGERY    . APPENDECTOMY    . CESAREAN SECTION    . CHOLECYSTECTOMY    . EYE SURGERY    . TONSILLECTOMY     Family History  Problem Relation Age of Onset  . Cancer Mother   . Coronary artery disease Father    Social History  Substance Use Topics  . Smoking status: Never Smoker  . Smokeless tobacco: Never Used  . Alcohol use Yes     Comment: rarely   OB History    No data available     Review of Systems  Constitutional: Positive for activity change, chills, fatigue and fever.  HENT: Positive for congestion, postnasal drip, sinus pain, sinus pressure and sneezing.   Respiratory: Positive for cough. Negative for shortness of breath, wheezing and stridor.   All other systems reviewed and are negative.   Allergies  Compazine [prochlorperazine edisylate] and Morphine and related  Home Medications   Prior to Admission medications   Medication Sig Start Date End Date Taking? Authorizing Provider  albuterol (PROVENTIL HFA;VENTOLIN HFA) 108 (90 Base) MCG/ACT inhaler Inhale 1-2 puffs into the lungs every 6 (six) hours as needed for wheezing or shortness of  breath. 12/31/15  Yes Norval Gable, MD  levothyroxine (SYNTHROID, LEVOTHROID) 25 MCG tablet Take 25 mcg by mouth daily before breakfast.   Yes Historical Provider, MD  lisinopril (PRINIVIL,ZESTRIL) 10 MG tablet Take 10 mg by mouth daily.   Yes Historical Provider, MD  benzonatate (TESSALON) 200 MG capsule Take 1 capsule (200 mg) 3 Times daily as necessary for coughing 09/30/16   Lorin Picket, PA-C  fluticasone (FLONASE) 50 MCG/ACT nasal spray Place 2 sprays into both nostrils daily. 09/30/16   Lorin Picket, PA-C  oseltamivir (TAMIFLU) 75 MG capsule Take 1 capsule (75 mg total) by mouth every 12 (twelve) hours. 09/30/16   Lorin Picket, PA-C   Meds Ordered and Administered this Visit  Medications - No data to display  BP (!) 152/77 (BP Location: Left Arm)   Pulse 92   Temp (!) 100.4 F (38 C) (Oral)   Resp 18   Ht 5' 3.5" (1.613 m)   Wt 186 lb (84.4 kg)   SpO2 100%   BMI 32.43 kg/m  No data found.   Physical Exam  Constitutional: She is oriented to person, place, and time. She appears well-developed and well-nourished. No distress.  HENT:  Head: Normocephalic and atraumatic.  Right Ear: External ear normal.  Left Ear: External ear normal.  Nose: Nose normal.  Mouth/Throat: Oropharynx is clear and moist.  Eyes: EOM are normal. Pupils are equal, round,  and reactive to light. Right eye exhibits no discharge. Left eye exhibits no discharge.  Neck: Normal range of motion. Neck supple.  Pulmonary/Chest: Effort normal and breath sounds normal. No respiratory distress. She has no wheezes. She has no rales.  Musculoskeletal: Normal range of motion.  Lymphadenopathy:    She has no cervical adenopathy.  Neurological: She is alert and oriented to person, place, and time.  Skin: Skin is warm and dry. She is not diaphoretic.  Psychiatric: She has a normal mood and affect. Her behavior is normal. Judgment and thought content normal.  Nursing note and vitals reviewed.   Urgent Care  Course   Clinical Course     Procedures (including critical care time)  Labs Review Labs Reviewed  RAPID INFLUENZA A&B ANTIGENS ([facility])    Imaging Review No results found.   Visual Acuity Review  Right Eye Distance:   Left Eye Distance:   Bilateral Distance:    Right Eye Near:   Left Eye Near:    Bilateral Near:         MDM   1. Upper respiratory tract infection, unspecified type    Discharge Medication List as of 09/30/2016 10:31 AM    START taking these medications   Details  benzonatate (TESSALON) 200 MG capsule Take 1 capsule (200 mg) 3 Times daily as necessary for coughing, Normal    fluticasone (FLONASE) 50 MCG/ACT nasal spray Place 2 sprays into both nostrils daily., Starting Tue 09/30/2016, Normal    oseltamivir (TAMIFLU) 75 MG capsule Take 1 capsule (75 mg total) by mouth every 12 (twelve) hours., Starting Tue 09/30/2016, Normal      Plan: 1. Test/x-ray results and diagnosis reviewed with patient 2. rx as per orders; risks, benefits, potential side effects reviewed with patient 3. Recommend supportive treatment with Rest and fluids. Use of Flonase to promote drainage would encourage her to use it 3-4 weeks. She is not improving she should follow-up with her primary care physician 4. F/u prn if symptoms worsen or don't improve     Lorin Picket, PA-C 09/30/16 1037

## 2017-02-20 ENCOUNTER — Encounter: Payer: Self-pay | Admitting: *Deleted

## 2017-02-23 ENCOUNTER — Ambulatory Visit: Payer: Medicare Other | Admitting: Certified Registered"

## 2017-02-23 ENCOUNTER — Encounter: Payer: Self-pay | Admitting: *Deleted

## 2017-02-23 ENCOUNTER — Ambulatory Visit
Admission: RE | Admit: 2017-02-23 | Discharge: 2017-02-23 | Disposition: A | Discharge status: Home / Self Care | Payer: Medicare Other | Source: Ambulatory Visit | Attending: Unknown Physician Specialty | Admitting: Unknown Physician Specialty

## 2017-02-23 ENCOUNTER — Encounter
Admission: RE | Disposition: A | Discharge status: Home / Self Care | Payer: Self-pay | Source: Ambulatory Visit | Attending: Unknown Physician Specialty

## 2017-02-23 DIAGNOSIS — Z79899 Other long term (current) drug therapy: Secondary | ICD-10-CM | POA: Insufficient documentation

## 2017-02-23 DIAGNOSIS — E669 Obesity, unspecified: Secondary | ICD-10-CM | POA: Diagnosis not present

## 2017-02-23 DIAGNOSIS — Z6831 Body mass index (BMI) 31.0-31.9, adult: Secondary | ICD-10-CM | POA: Diagnosis not present

## 2017-02-23 DIAGNOSIS — E039 Hypothyroidism, unspecified: Secondary | ICD-10-CM | POA: Insufficient documentation

## 2017-02-23 DIAGNOSIS — Z1211 Encounter for screening for malignant neoplasm of colon: Secondary | ICD-10-CM | POA: Diagnosis present

## 2017-02-23 DIAGNOSIS — Z800 Family history of malignant neoplasm of digestive organs: Secondary | ICD-10-CM | POA: Insufficient documentation

## 2017-02-23 DIAGNOSIS — Z885 Allergy status to narcotic agent status: Secondary | ICD-10-CM | POA: Insufficient documentation

## 2017-02-23 DIAGNOSIS — D123 Benign neoplasm of transverse colon: Secondary | ICD-10-CM | POA: Diagnosis not present

## 2017-02-23 DIAGNOSIS — K640 First degree hemorrhoids: Secondary | ICD-10-CM | POA: Diagnosis not present

## 2017-02-23 DIAGNOSIS — K5730 Diverticulosis of large intestine without perforation or abscess without bleeding: Secondary | ICD-10-CM | POA: Diagnosis not present

## 2017-02-23 DIAGNOSIS — Z888 Allergy status to other drugs, medicaments and biological substances status: Secondary | ICD-10-CM | POA: Insufficient documentation

## 2017-02-23 DIAGNOSIS — I10 Essential (primary) hypertension: Secondary | ICD-10-CM | POA: Diagnosis not present

## 2017-02-23 HISTORY — DX: Solitary cyst of unspecified breast: N60.09

## 2017-02-23 HISTORY — DX: Hypothyroidism, unspecified: E03.9

## 2017-02-23 HISTORY — PX: COLONOSCOPY WITH PROPOFOL: SHX5780

## 2017-02-23 SURGERY — COLONOSCOPY WITH PROPOFOL
Anesthesia: General

## 2017-02-23 MED ORDER — SODIUM CHLORIDE 0.9 % IV SOLN
INTRAVENOUS | Status: DC
  Administered 2017-02-23: 10:00:00 via INTRAVENOUS

## 2017-02-23 MED ORDER — PROPOFOL 500 MG/50ML IV EMUL
INTRAVENOUS | Status: AC
Start: 2017-02-23 — End: 2017-02-23
  Filled 2017-02-23: qty 50

## 2017-02-23 MED ORDER — LIDOCAINE HCL (PF) 2 % IJ SOLN
INTRAMUSCULAR | Status: AC
  Filled 2017-02-23: qty 2

## 2017-02-23 MED ORDER — PROPOFOL 500 MG/50ML IV EMUL
INTRAVENOUS | Status: DC | PRN
  Administered 2017-02-23: 150 ug/kg/min via INTRAVENOUS

## 2017-02-23 MED ORDER — SODIUM CHLORIDE 0.9 % IV SOLN: INTRAVENOUS | Status: DC

## 2017-02-23 MED ORDER — LIDOCAINE HCL (CARDIAC) 20 MG/ML IV SOLN
INTRAVENOUS | Status: DC | PRN
  Administered 2017-02-23: 50 mg via INTRAVENOUS

## 2017-02-23 MED ORDER — PROPOFOL 10 MG/ML IV BOLUS
INTRAVENOUS | Status: DC | PRN
  Administered 2017-02-23: 60 mg via INTRAVENOUS

## 2017-02-23 NOTE — Transfer of Care (Signed)
Immediate Anesthesia Transfer of Care Note  Patient: Abigail Nguyen  Procedure(s) Performed: Procedure(s): COLONOSCOPY WITH PROPOFOL (N/A)  Patient Location: PACU  Anesthesia Type:General  Level of Consciousness: sedated  Airway & Oxygen Therapy: Patient Spontanous Breathing and Patient connected to nasal cannula oxygen  Post-op Assessment: Report given to RN and Post -op Vital signs reviewed and stable  Post vital signs: Reviewed and stable  Last Vitals:  Vitals:   02/23/17 1012 02/23/17 1104  BP: 127/78 (!) 93/58  Pulse: 72 69  Resp:  13  Temp: 36.5 C     Last Pain:  Vitals:   02/23/17 1012  TempSrc: Tympanic         Complications: No apparent anesthesia complications

## 2017-02-23 NOTE — Anesthesia Procedure Notes (Signed)
Performed by: Daven Montz Pre-anesthesia Checklist: Patient identified, Emergency Drugs available, Suction available, Patient being monitored and Timeout performed Patient Re-evaluated:Patient Re-evaluated prior to inductionOxygen Delivery Method: Nasal cannula Preoxygenation: Pre-oxygenation with 100% oxygen Intubation Type: IV induction       

## 2017-02-23 NOTE — Op Note (Signed)
[facility] Gastroenterology Patient Name: Abigail Nguyen Procedure Date: 02/23/2017 10:24 AM MRN: [id number] Account #: [id number] Date of Birth: 1951-08-30 Admit Type: Outpatient Age: 67 Room: [facility] ENDO ROOM 3 Gender: Female Note Status: Finalized Procedure:            Colonoscopy Indications:          Screening in patient at increased risk: Family history                        of 1st-degree relative with colorectal cancer Providers:            Manya Silvas, MD Referring MD:         Ocie Cornfield. Ouida Sills MD, MD (Referring MD) Medicines:            Propofol per Anesthesia Complications:        No immediate complications. Procedure:            Pre-Anesthesia Assessment:                       - After reviewing the risks and benefits, the patient                        was deemed in satisfactory condition to undergo the                        procedure.                       After obtaining informed consent, the colonoscope was                        passed under direct vision. Throughout the procedure,                        the patient's blood pressure, pulse, and oxygen                        saturations were monitored continuously. The                        Colonoscope was introduced through the anus and                        advanced to the the cecum, identified by appendiceal                        orifice and ileocecal valve. The colonoscopy was                        performed without difficulty. The patient tolerated the                        procedure well. The quality of the bowel preparation                        was excellent. Findings:      A diminutive polyp was found in the hepatic flexure. The polyp was       sessile. The polyp was removed with a jumbo cold forceps. Resection and       retrieval were complete.  A few medium-mouthed diverticula were found in the sigmoid colon and       proximal sigmoid colon.      Internal hemorrhoids  were found during endoscopy. The hemorrhoids were       small and Grade I (internal hemorrhoids that do not prolapse).      The exam was otherwise without abnormality. Impression:           - One diminutive polyp at the hepatic flexure, removed                        with a jumbo cold forceps. Resected and retrieved.                       - Diverticulosis in the sigmoid colon and in the                        proximal sigmoid colon.                       - Internal hemorrhoids.                       - The examination was otherwise normal. Recommendation:       - Await pathology results. Manya Silvas, MD 02/23/2017 11:01:40 AM This report has been signed electronically. Number of Addenda: 0 Note Initiated On: 02/23/2017 10:24 AM Scope Withdrawal Time: 0 hours 12 minutes 5 seconds  Total Procedure Duration: 0 hours 19 minutes 30 seconds       [facility]

## 2017-02-23 NOTE — H&P (Signed)
   Primary Care Physician:  Kirk Ruths, MD Primary Gastroenterologist:  Dr. Vira Agar  Pre-Procedure History & Physical: HPI:  Abigail Nguyen is a 66 y.o. female is here for an colonoscopy.   Past Medical History:  Diagnosis Date  . Breast cyst   . Hypertension   . Hypothyroidism   . Thyroid disease     Past Surgical History:  Procedure Laterality Date  . ANKLE SURGERY    . APPENDECTOMY    . CESAREAN SECTION    . CHOLECYSTECTOMY    . EYE SURGERY    . laparascopy N/A 1981   diagnostic  . TONSILLECTOMY      Prior to Admission medications   Medication Sig Start Date End Date Taking? Authorizing Provider  calcium carbonate (TUMS - DOSED IN MG ELEMENTAL CALCIUM) 500 MG chewable tablet Chew 1 tablet by mouth daily.   Yes [provider]  levothyroxine (SYNTHROID, LEVOTHROID) 25 MCG tablet Take 25 mcg by mouth daily before breakfast.   Yes [provider]  lisinopril (PRINIVIL,ZESTRIL) 10 MG tablet Take 10 mg by mouth daily.   Yes [provider]    Allergies as of 02/16/2017 - Review Complete 09/30/2016  Allergen Reaction Noted  . Compazine [prochlorperazine edisylate] Nausea And Vomiting 06/28/2015  . Morphine and related Nausea And Vomiting 06/28/2015    Family History  Problem Relation Age of Onset  . Cancer Mother   . Coronary artery disease Father     Social History   Social History  . Marital status: Married    Spouse name: N/A  . Number of children: N/A  . Years of education: N/A   Occupational History  . Not on file.   Social History Main Topics  . Smoking status: Never Smoker  . Smokeless tobacco: Never Used  . Alcohol use Yes     Comment: rarely  . Drug use: No  . Sexual activity: Not on file   Other Topics Concern  . Not on file   Social History Narrative  . No narrative on file    Review of Systems: See HPI, otherwise negative ROS  Physical Exam: BP 127/78   Pulse 72   Temp 97.7 F (36.5 C)  (Tympanic)   Resp 16   Ht 5\' 4"  (1.626 m)   Wt 82.1 kg (181 lb)   SpO2 99%   BMI 31.07 kg/m  General:   Alert,  pleasant and cooperative in NAD Head:  Normocephalic and atraumatic. Neck:  Supple; no masses or thyromegaly. Lungs:  Clear throughout to auscultation.    Heart:  Regular rate and rhythm. Abdomen:  Soft, nontender and nondistended. Normal bowel sounds, without guarding, and without rebound.   Neurologic:  Alert and  oriented x4;  grossly normal neurologically.  Impression/Plan: Abigail Nguyen is here for an colonoscopy to be performed for screening due to [place] [facility] in mother  Risks, benefits, limitations, and alternatives regarding  colonoscopy have been reviewed with the patient.  Questions have been answered.  All parties agreeable.   Gaylyn Cheers, MD  02/23/2017, 10:28 AM

## 2017-02-23 NOTE — Anesthesia Preprocedure Evaluation (Addendum)
Anesthesia Evaluation  Patient identified by MRN, date of birth, ID band Patient awake    Reviewed: Allergy & Precautions, NPO status , Patient's Chart, lab work & pertinent test results  History of Anesthesia Complications Negative for: history of anesthetic complications  Airway Mallampati: II  TM Distance: >3 FB Neck ROM: Full    Dental no notable dental hx.    Pulmonary neg pulmonary ROS, neg sleep apnea, neg COPD,    breath sounds clear to auscultation- rhonchi (-) wheezing      Cardiovascular hypertension, Pt. on medications (-) CAD, (-) Past MI and (-) Cardiac Stents  Rhythm:Regular Rate:Normal - Systolic murmurs and - Diastolic murmurs    Neuro/Psych negative neurological ROS  negative psych ROS   GI/Hepatic negative GI ROS, Neg liver ROS,   Endo/Other  neg diabetesHypothyroidism   Renal/GU negative Renal ROS     Musculoskeletal negative musculoskeletal ROS (+)   Abdominal (+) + obese,   Peds  Hematology negative hematology ROS (+)   Anesthesia Other Findings Past Medical History: No date: Breast cyst No date: Hypertension No date: Hypothyroidism No date: Thyroid disease   Reproductive/Obstetrics                             Anesthesia Physical Anesthesia Plan  ASA: II  Anesthesia Plan: General   Post-op Pain Management:    Induction: Intravenous  PONV Risk Score and Plan: 2 and Propofol  Airway Management Planned: Natural Airway  Additional Equipment:   Intra-op Plan:   Post-operative Plan:   Informed Consent: I have reviewed the patients History and Physical, chart, labs and discussed the procedure including the risks, benefits and alternatives for the proposed anesthesia with the patient or authorized representative who has indicated his/her understanding and acceptance.   Dental advisory given  Plan Discussed with: CRNA and Anesthesiologist  Anesthesia  Plan Comments:         Anesthesia Quick Evaluation

## 2017-02-23 NOTE — Anesthesia Post-op Follow-up Note (Cosign Needed)
Anesthesia QCDR form completed.        

## 2017-02-23 NOTE — Anesthesia Postprocedure Evaluation (Signed)
Anesthesia Post Note  Patient: Abigail Nguyen  Procedure(s) Performed: Procedure(s) (LRB): COLONOSCOPY WITH PROPOFOL (N/A)  Patient location during evaluation: Endoscopy Anesthesia Type: General Level of consciousness: awake and alert and oriented Pain management: pain level controlled Vital Signs Assessment: post-procedure vital signs reviewed and stable Respiratory status: spontaneous breathing, nonlabored ventilation and respiratory function stable Cardiovascular status: blood pressure returned to baseline and stable Postop Assessment: no signs of nausea or vomiting Anesthetic complications: no     Last Vitals:  Vitals:   02/23/17 1143 02/23/17 1153  BP: 122/76   Pulse: 70 69  Resp: 17 (!) 21  Temp:      Last Pain:  Vitals:   02/23/17 1103  TempSrc: Tympanic                 Marckus Hanover

## 2017-02-24 ENCOUNTER — Encounter: Payer: Self-pay | Admitting: Unknown Physician Specialty

## 2017-02-24 LAB — SURGICAL PATHOLOGY

## 2017-04-06 ENCOUNTER — Other Ambulatory Visit: Payer: Self-pay | Admitting: Internal Medicine

## 2017-04-06 DIAGNOSIS — Z1231 Encounter for screening mammogram for malignant neoplasm of breast: Secondary | ICD-10-CM

## 2017-04-09 ENCOUNTER — Other Ambulatory Visit: Payer: Self-pay | Admitting: Internal Medicine

## 2017-04-09 DIAGNOSIS — R9389 Abnormal findings on diagnostic imaging of other specified body structures: Secondary | ICD-10-CM

## 2017-04-17 ENCOUNTER — Ambulatory Visit: Payer: Medicare Other

## 2017-04-20 ENCOUNTER — Ambulatory Visit
Admission: RE | Admit: 2017-04-20 | Discharge: 2017-04-20 | Disposition: A | Discharge status: Home / Self Care | Payer: Medicare Other | Source: Ambulatory Visit | Attending: Internal Medicine | Admitting: Internal Medicine

## 2017-04-20 DIAGNOSIS — D3502 Benign neoplasm of left adrenal gland: Secondary | ICD-10-CM | POA: Diagnosis not present

## 2017-04-20 DIAGNOSIS — Z9049 Acquired absence of other specified parts of digestive tract: Secondary | ICD-10-CM | POA: Insufficient documentation

## 2017-04-20 DIAGNOSIS — K449 Diaphragmatic hernia without obstruction or gangrene: Secondary | ICD-10-CM | POA: Diagnosis not present

## 2017-04-20 DIAGNOSIS — R938 Abnormal findings on diagnostic imaging of other specified body structures: Secondary | ICD-10-CM | POA: Diagnosis not present

## 2017-04-20 DIAGNOSIS — R9389 Abnormal findings on diagnostic imaging of other specified body structures: Secondary | ICD-10-CM

## 2017-04-20 MED ORDER — IOPAMIDOL (ISOVUE-300) INJECTION 61%
75.0000 mL | Freq: Once | INTRAVENOUS | Status: AC | PRN
  Administered 2017-04-20: 75 mL via INTRAVENOUS

## 2017-04-21 ENCOUNTER — Ambulatory Visit
Admission: RE | Admit: 2017-04-21 | Discharge: 2017-04-21 | Disposition: A | Discharge status: Home / Self Care | Payer: Medicare Other | Source: Ambulatory Visit | Attending: Internal Medicine | Admitting: Internal Medicine

## 2017-04-21 DIAGNOSIS — Z1231 Encounter for screening mammogram for malignant neoplasm of breast: Secondary | ICD-10-CM | POA: Diagnosis not present

## 2017-05-25 ENCOUNTER — Other Ambulatory Visit: Payer: Self-pay | Admitting: Internal Medicine

## 2017-05-25 DIAGNOSIS — D3502 Benign neoplasm of left adrenal gland: Secondary | ICD-10-CM

## 2017-11-04 ENCOUNTER — Other Ambulatory Visit: Payer: Self-pay | Admitting: Internal Medicine

## 2017-11-04 ENCOUNTER — Other Ambulatory Visit: Payer: Self-pay | Admitting: Radiology

## 2017-11-24 ENCOUNTER — Ambulatory Visit: Payer: Medicare Other

## 2017-11-24 ENCOUNTER — Ambulatory Visit
Admission: RE | Admit: 2017-11-24 | Discharge: 2017-11-24 | Disposition: A | Discharge status: Home / Self Care | Payer: Medicare Other | Source: Ambulatory Visit | Attending: Internal Medicine | Admitting: Internal Medicine

## 2017-11-24 DIAGNOSIS — K449 Diaphragmatic hernia without obstruction or gangrene: Secondary | ICD-10-CM | POA: Diagnosis not present

## 2017-11-24 DIAGNOSIS — D3502 Benign neoplasm of left adrenal gland: Secondary | ICD-10-CM | POA: Diagnosis present

## 2018-05-31 ENCOUNTER — Other Ambulatory Visit: Payer: Self-pay | Admitting: Internal Medicine

## 2018-05-31 DIAGNOSIS — Z1231 Encounter for screening mammogram for malignant neoplasm of breast: Secondary | ICD-10-CM

## 2018-06-01 ENCOUNTER — Ambulatory Visit: Payer: Medicare Other

## 2018-06-09 ENCOUNTER — Ambulatory Visit
Admission: RE | Admit: 2018-06-09 | Discharge: 2018-06-09 | Disposition: A | Discharge status: Home / Self Care | Payer: Medicare Other | Source: Ambulatory Visit | Attending: Internal Medicine | Admitting: Internal Medicine

## 2018-06-09 DIAGNOSIS — Z1231 Encounter for screening mammogram for malignant neoplasm of breast: Secondary | ICD-10-CM

## 2018-12-12 ENCOUNTER — Ambulatory Visit
Admission: EM | Admit: 2018-12-12 | Discharge: 2018-12-12 | Disposition: A | Discharge status: Home / Self Care | Payer: Medicare Other | Attending: Family Medicine | Admitting: Family Medicine

## 2018-12-12 ENCOUNTER — Other Ambulatory Visit: Payer: Self-pay

## 2018-12-12 ENCOUNTER — Ambulatory Visit (INDEPENDENT_AMBULATORY_CARE_PROVIDER_SITE_OTHER): Payer: Medicare Other

## 2018-12-12 ENCOUNTER — Encounter: Payer: Self-pay | Admitting: Gynecology

## 2018-12-12 DIAGNOSIS — K5792 Diverticulitis of intestine, part unspecified, without perforation or abscess without bleeding: Secondary | ICD-10-CM

## 2018-12-12 LAB — URINALYSIS, COMPLETE (UACMP) WITH MICROSCOPIC
BACTERIA UA: NONE SEEN
Bilirubin Urine: NEGATIVE
GLUCOSE, UA: NEGATIVE mg/dL
Ketones, ur: NEGATIVE mg/dL
Nitrite: NEGATIVE
PROTEIN: NEGATIVE mg/dL
Specific Gravity, Urine: 1.015 (ref 1.005–1.030)
pH: 6 (ref 5.0–8.0)

## 2018-12-12 LAB — COMPREHENSIVE METABOLIC PANEL
ALT: 14 U/L (ref 0–44)
AST: 16 U/L (ref 15–41)
Albumin: 4.1 g/dL (ref 3.5–5.0)
Alkaline Phosphatase: 99 U/L (ref 38–126)
Anion gap: 7 (ref 5–15)
BUN: 13 mg/dL (ref 8–23)
CO2: 25 mmol/L (ref 22–32)
Calcium: 8.9 mg/dL (ref 8.9–10.3)
Chloride: 104 mmol/L (ref 98–111)
Creatinine, Ser: 0.7 mg/dL (ref 0.44–1.00)
GFR calc Af Amer: >60 mL/min (ref >60)
GFR calc non Af Amer: >60 mL/min (ref >60)
Glucose, Bld: 106 mg/dL — ABNORMAL HIGH (ref 70–99)
Potassium: 4.1 mmol/L (ref 3.5–5.1)
Sodium: 136 mmol/L (ref 135–145)
Total Bilirubin: 0.4 mg/dL (ref 0.3–1.2)
Total Protein: 7.6 g/dL (ref 6.5–8.1)

## 2018-12-12 LAB — CBC WITH DIFFERENTIAL/PLATELET
Abs Immature Granulocytes: 0.05 10*3/uL (ref 0.00–0.07)
Basophils Absolute: 0 10*3/uL (ref 0.0–0.1)
Basophils Relative: 0 %
EOS PCT: 0 %
Eosinophils Absolute: 0 10*3/uL (ref 0.0–0.5)
HCT: 37 % (ref 36.0–46.0)
Hemoglobin: 12.2 g/dL (ref 12.0–15.0)
Immature Granulocytes: 0 %
Lymphocytes Relative: 9 %
Lymphs Abs: 1.1 10*3/uL (ref 0.7–4.0)
MCH: 29.3 pg (ref 26.0–34.0)
MCHC: 33 g/dL (ref 30.0–36.0)
MCV: 88.7 fL (ref 80.0–100.0)
MONO ABS: 1.2 10*3/uL — AB (ref 0.1–1.0)
Monocytes Relative: 10 %
Neutro Abs: 10.2 10*3/uL — ABNORMAL HIGH (ref 1.7–7.7)
Neutrophils Relative %: 81 %
Platelets: 305 10*3/uL (ref 150–400)
RBC: 4.17 MIL/uL (ref 3.87–5.11)
RDW: 13.2 % (ref 11.5–15.5)
WBC: 12.6 10*3/uL — AB (ref 4.0–10.5)
nRBC: 0 % (ref 0.0–0.2)

## 2018-12-12 MED ORDER — METRONIDAZOLE 500 MG PO TABS: 500.0000 mg | ORAL_TABLET | Freq: Three times a day (TID) | ORAL | 0 refills | Status: DC

## 2018-12-12 MED ORDER — CIPROFLOXACIN HCL 500 MG PO TABS: 500.0000 mg | ORAL_TABLET | Freq: Two times a day (BID) | ORAL | 0 refills | Status: DC

## 2018-12-12 NOTE — ED Provider Notes (Signed)
[facility]    CSN: [id number] Arrival date & time: 12/12/18  1153  History   Chief Complaint Abdominal pain  HPI   68 year old female presents with lower abdominal pain.  Patient is concerned that she has a UTI.  Patient reports that her symptoms started yesterday.  She reports bilateral lower abdominal pain.  Denies dysuria, urgency, frequency.  She is concerned that she has a UTI based on the fact that she has lower abdominal pain and has a history of prior UTI.  Currently rates her pain is 9/10 in severity.  She does not appear to be in any distress.  No reports of fever.  No reports of constipation or diarrhea.  No known exacerbating or relieving factors.  No other reported symptoms.  No other complaints.  PMH, Surgical Hx, Family Hx, Social History reviewed and updated as below.  Past Medical History:  Diagnosis Date  . Breast cyst   . Hypertension   . Hypothyroidism   . Thyroid disease    Past Surgical History:  Procedure Laterality Date  . ANKLE SURGERY    . APPENDECTOMY    . BREAST CYST ASPIRATION Right    neg  . CESAREAN SECTION    . CHOLECYSTECTOMY    . COLONOSCOPY WITH PROPOFOL N/A 02/23/2017   Procedure: COLONOSCOPY WITH PROPOFOL;  Surgeon: Manya Silvas, MD;  Location: [facility] ENDOSCOPY;  Service: Endoscopy;  Laterality: N/A;  . EYE SURGERY    . laparascopy N/A 1981   diagnostic  . TONSILLECTOMY     OB History   No obstetric history on file.    Home Medications    Prior to Admission medications   Medication Sig Start Date End Date Taking? Authorizing Provider  carvedilol (COREG) 3.125 MG tablet Take by mouth. 12/07/18 12/07/19 Yes [provider]  levothyroxine (SYNTHROID, LEVOTHROID) 25 MCG tablet Take 25 mcg by mouth daily before breakfast.   Yes [provider]  lisinopril (PRINIVIL,ZESTRIL) 10 MG tablet Take 10 mg by mouth daily.   Yes [provider]  calcium carbonate (TUMS - DOSED IN MG ELEMENTAL CALCIUM) 500  MG chewable tablet Chew 1 tablet by mouth daily.    [provider]  ciprofloxacin (CIPRO) 500 MG tablet Take 1 tablet (500 mg total) by mouth 2 (two) times daily for 7 days. 12/12/18 12/19/18  Coral Spikes, DO  metroNIDAZOLE (FLAGYL) 500 MG tablet Take 1 tablet (500 mg total) by mouth 3 (three) times daily for 7 days. 12/12/18 12/19/18  Coral Spikes, DO    Family History Family History  Problem Relation Age of Onset  . Cancer Mother   . Coronary artery disease Father   . Breast cancer Maternal Grandmother 51    Social History Social History   Tobacco Use  . Smoking status: Never Smoker  . Smokeless tobacco: Never Used  Substance Use Topics  . Alcohol use: Yes    Comment: rarely  . Drug use: No     Allergies   Compazine [prochlorperazine edisylate] and Morphine and related   Review of Systems Review of Systems  Constitutional: Negative for fever.  Gastrointestinal: Positive for abdominal pain.  Genitourinary: Negative.    Physical Exam Triage Vital Signs ED Triage Vitals  Enc Vitals Group     BP 12/12/18 1209 118/73     Pulse Rate 12/12/18 1209 78     Resp 12/12/18 1209 16     Temp 12/12/18 1209 99.1 F (37.3 C)     Temp  Source 12/12/18 1209 Oral     SpO2 12/12/18 1209 98 %     Weight 12/12/18 1210 187 lb (84.8 kg)     Height 12/12/18 1210 5\' 4"  (1.626 m)     Head Circumference --      Peak Flow --      Pain Score 12/12/18 1210 9     Pain Loc --      Pain Edu? --      Excl. in [place]? --    Updated Vital Signs BP 118/73 (BP Location: Left Arm)   Pulse 78   Temp 99.1 F (37.3 C) (Oral)   Resp 16   Ht 5\' 4"  (1.626 m)   Wt 84.8 kg   SpO2 98%   BMI 32.10 kg/m   Visual Acuity Right Eye Distance:   Left Eye Distance:   Bilateral Distance:    Right Eye Near:   Left Eye Near:    Bilateral Near:     Physical Exam Vitals signs and nursing note reviewed.  Constitutional:      General: She is not in acute distress.    Appearance: Normal  appearance.  HENT:     Head: Normocephalic and atraumatic.  Eyes:     General: No scleral icterus.    Conjunctiva/sclera: Conjunctivae normal.  Cardiovascular:     Rate and Rhythm: Normal rate and regular rhythm.  Pulmonary:     Effort: Pulmonary effort is normal.     Breath sounds: Normal breath sounds.  Abdominal:     General: There is no distension.     Palpations: Abdomen is soft.     Comments: Tender to palpation in the lower abdomen, left lower quadrant greater than right lower quadrant.  Neurological:     Mental Status: She is alert.  Psychiatric:        Mood and Affect: Mood normal.        Behavior: Behavior normal.    UC Treatments / Results  Labs (all labs ordered are listed, but only abnormal results are displayed) Labs Reviewed  URINALYSIS, COMPLETE (UACMP) WITH MICROSCOPIC - Abnormal; Notable for the following components:      Result Value   Color, Urine STRAW (*)    Hgb urine dipstick TRACE (*)    Leukocytes,Ua SMALL (*)    All other components within normal limits  COMPREHENSIVE METABOLIC PANEL - Abnormal; Notable for the following components:   Glucose, Bld 106 (*)    All other components within normal limits  CBC WITH DIFFERENTIAL/PLATELET - Abnormal; Notable for the following components:   WBC 12.6 (*)    Neutro Abs 10.2 (*)    Monocytes Absolute 1.2 (*)    All other components within normal limits  URINE CULTURE    EKG None  Radiology Ct Abdomen Pelvis Wo Contrast  Result Date: 12/12/2018 CLINICAL DATA:  Abdominopelvic pain since yesterday morning. History of benign left adrenal lesion. Cholecystectomy and appendectomy. EXAM: CT ABDOMEN AND PELVIS WITHOUT CONTRAST TECHNIQUE: Multidetector CT imaging of the abdomen and pelvis was performed following the standard protocol without IV contrast. COMPARISON:  11/24/2017 FINDINGS: Lower chest: Clear lung bases. Normal heart size without pericardial or pleural effusion. Moderate hiatal hernia.  Hepatobiliary: Atypical hepatic morphology, with prominence of the lateral segment left liver lobe period No focal liver lesion. Cholecystectomy, without biliary ductal dilatation. Pancreas: Heterogeneous fatty replacement throughout the pancreas. No duct dilatation or acute inflammation. Spleen: Normal in size, without focal abnormality. Adrenals/Urinary Tract: 1.2 cm left adrenal adenoma,  as before. Normal right adrenal gland. Mild renal cortical thinning bilaterally. Suspect a punctate lower pole left renal collecting system calculus on coronal reformats. No hydroureter or ureteric calculi. No bladder calculi. Stomach/Bowel: Normal distal stomach. Scattered colonic diverticula. Wall thickening of and edema surrounding the sigmoid, including on image 61/2. No pericolonic fluid collection or free perforation. Normal terminal ileum. Normal small bowel. Vascular/Lymphatic: Aortic atherosclerosis. No abdominopelvic adenopathy. Reproductive: Normal uterus and adnexa. Other: Trace cul-de-sac fluid, likely secondary. Musculoskeletal: No acute osseous abnormality. Grade 1 anterolisthesis of L5 on S1. Degenerate disc disease at this level. IMPRESSION: 1. Non complicated sigmoid diverticulitis. 2. Moderate hiatal hernia. 3. Left adrenal adenoma. 4. Probable left nephrolithiasis. 5. Atypical hepatic morphology, nonspecific. Correlate with risk factors for mild cirrhosis, which could have this appearance. Electronically Signed   By: Abigail Miyamoto M.D.   On: 12/12/2018 13:30    Procedures Procedures (including critical care time)  Medications Ordered in UC Medications - No data to display  Initial Impression / Assessment and Plan / UC Course  I have reviewed the triage vital signs and the nursing notes.  Pertinent labs & imaging results that were available during my care of the patient were reviewed by me and considered in my medical decision making (see chart for details).    68 year old female presents with  acute diverticulitis.  Treating with Cipro and Flagyl.  Final Clinical Impressions(s) / UC Diagnoses   Final diagnoses:  Diverticulitis   Discharge Instructions   None    ED Prescriptions    Medication Sig Dispense Auth. Provider   ciprofloxacin (CIPRO) 500 MG tablet Take 1 tablet (500 mg total) by mouth 2 (two) times daily for 7 days. 14 tablet Garreth Burnsworth G, DO   metroNIDAZOLE (FLAGYL) 500 MG tablet Take 1 tablet (500 mg total) by mouth 3 (three) times daily for 7 days. 21 tablet Coral Spikes, DO     Controlled Substance Prescriptions [place] Controlled Substance Registry consulted? Not Applicable   Coral Spikes, DO 12/12/18 1338

## 2018-12-12 NOTE — ED Triage Notes (Signed)
Patient c/o low pelvic pain x yesterday. Per patient question urinary tract infection.

## 2018-12-14 ENCOUNTER — Other Ambulatory Visit: Payer: Self-pay

## 2018-12-14 ENCOUNTER — Ambulatory Visit
Admission: EM | Admit: 2018-12-14 | Discharge: 2018-12-14 | Disposition: A | Discharge status: Home / Self Care | Payer: Medicare Other | Attending: Family Medicine | Admitting: Family Medicine

## 2018-12-14 DIAGNOSIS — M546 Pain in thoracic spine: Secondary | ICD-10-CM

## 2018-12-14 LAB — URINE CULTURE: Culture: <10000 — AB

## 2018-12-14 MED ORDER — TRAMADOL HCL 50 MG PO TABS: 50.0000 mg | ORAL_TABLET | Freq: Three times a day (TID) | ORAL | 0 refills | Status: DC | PRN

## 2018-12-14 MED ORDER — AMOXICILLIN-POT CLAVULANATE 875-125 MG PO TABS: 1.0000 | ORAL_TABLET | Freq: Two times a day (BID) | ORAL | 0 refills | Status: DC

## 2018-12-14 NOTE — Discharge Instructions (Signed)
Stop the antibiotics.  Start Augmentin.  Pain medication as needed.

## 2018-12-14 NOTE — ED Triage Notes (Signed)
Patient complains of mid back pain that radiates around sides. Patient states that her stomach feels "very tight". States that she was diagnosed with diverticulitis on Sunday and has been taking Flagyl and Cipro. Concerned that this is a side effect of the medication.

## 2018-12-14 NOTE — ED Provider Notes (Signed)
[facility]    CSN: [id number] Arrival date & time: 12/14/18  1554  History   Chief Complaint Chief Complaint  Patient presents with  . Back Pain   HPI  68 year old female presents with the above complaint.  Patient recently seen on 3/29 by me.  CT scan revealed diverticulitis.  Patient also had left nephrolithiasis.  It was located in the collecting system.  Was not in the ureter.  Patient states that her abdominal pain is improved.  She endorses compliance with antibiotic therapy.  Patient states that last night she developed bilateral thoracic back pain.  She states that her abdomen feels bloated.  No reports of fever.  She is concerned that she has having an adverse medication side effect.  She states that her pain was worse last night and she could not get comfortable in bed.  Was not relieved with a warm bath.  No reports of nausea or vomiting.  No other associated symptoms.  Hx reviewed as below. Past Medical History:  Diagnosis Date  . Breast cyst   . Hypertension   . Hypothyroidism   . Thyroid disease    Past Surgical History:  Procedure Laterality Date  . ANKLE SURGERY    . APPENDECTOMY    . BREAST CYST ASPIRATION Right    neg  . CESAREAN SECTION    . CHOLECYSTECTOMY    . COLONOSCOPY WITH PROPOFOL N/A 02/23/2017   Procedure: COLONOSCOPY WITH PROPOFOL;  Surgeon: Manya Silvas, MD;  Location: [facility] ENDOSCOPY;  Service: Endoscopy;  Laterality: N/A;  . EYE SURGERY    . laparascopy N/A 1981   diagnostic  . TONSILLECTOMY     OB History   No obstetric history on file.    Home Medications    Prior to Admission medications   Medication Sig Start Date End Date Taking? Authorizing Provider  calcium carbonate (TUMS - DOSED IN MG ELEMENTAL CALCIUM) 500 MG chewable tablet Chew 1 tablet by mouth daily.   Yes [provider]  carvedilol (COREG) 3.125 MG tablet Take by mouth. 12/07/18 12/07/19 Yes [provider]  levothyroxine (SYNTHROID,  LEVOTHROID) 25 MCG tablet Take 25 mcg by mouth daily before breakfast.   Yes [provider]  lisinopril (PRINIVIL,ZESTRIL) 10 MG tablet Take 10 mg by mouth daily.   Yes [provider]  amoxicillin-clavulanate (AUGMENTIN) 875-125 MG tablet Take 1 tablet by mouth every 12 (twelve) hours. 12/14/18   Coral Spikes, DO  traMADol (ULTRAM) 50 MG tablet Take 1 tablet (50 mg total) by mouth every 8 (eight) hours as needed. 12/14/18   Coral Spikes, DO    Family History Family History  Problem Relation Age of Onset  . Cancer Mother   . Coronary artery disease Father   . Breast cancer Maternal Grandmother 74    Social History Social History   Tobacco Use  . Smoking status: Never Smoker  . Smokeless tobacco: Never Used  Substance Use Topics  . Alcohol use: Yes    Comment: rarely  . Drug use: No   Allergies   Codeine; Compazine [prochlorperazine edisylate]; and Morphine and related  Review of Systems Review of Systems  Constitutional: Negative for fever.  Musculoskeletal: Positive for back pain.   Physical Exam Triage Vital Signs ED Triage Vitals  Enc Vitals Group     BP 12/14/18 1607 129/81     Pulse Rate 12/14/18 1607 75     Resp 12/14/18 1607 16     Temp 12/14/18 1607  98.3 F (36.8 C)     Temp Source 12/14/18 1607 Oral     SpO2 12/14/18 1607 98 %     Weight 12/14/18 1605 187 lb (84.8 kg)     Height 12/14/18 1605 5\' 4"  (1.626 m)     Head Circumference --      Peak Flow --      Pain Score 12/14/18 1604 8     Pain Loc --      Pain Edu? --      Excl. in [place]? --    Updated Vital Signs BP 129/81 (BP Location: Left Arm)   Pulse 75   Temp 98.3 F (36.8 C) (Oral)   Resp 16   Ht 5\' 4"  (1.626 m)   Wt 84.8 kg   SpO2 98%   BMI 32.10 kg/m   Visual Acuity Right Eye Distance:   Left Eye Distance:   Bilateral Distance:    Right Eye Near:   Left Eye Near:    Bilateral Near:     Physical Exam Vitals signs and nursing note reviewed.  Constitutional:       General: She is not in acute distress.    Appearance: Normal appearance.  HENT:     Head: Normocephalic and atraumatic.  Eyes:     General: No scleral icterus.    Conjunctiva/sclera: Conjunctivae normal.  Cardiovascular:     Rate and Rhythm: Normal rate and regular rhythm.  Pulmonary:     Effort: Pulmonary effort is normal.     Breath sounds: Normal breath sounds.  Abdominal:     Palpations: Abdomen is soft.     Comments: Tenderness in the left lower quadrant as well as right lower quadrant.  Musculoskeletal:     Comments: Patient with tenderness of the musculature of the lower thoracic spine bilaterally.  Neurological:     Mental Status: She is alert.  Psychiatric:        Mood and Affect: Mood normal.        Behavior: Behavior normal.    UC Treatments / Results  Labs (all labs ordered are listed, but only abnormal results are displayed) Labs Reviewed - No data to display  EKG None  Radiology No results found.  Procedures Procedures (including critical care time)  Medications Ordered in UC Medications - No data to display  Initial Impression / Assessment and Plan / UC Course  I have reviewed the triage vital signs and the nursing notes.  Pertinent labs & imaging results that were available during my care of the patient were reviewed by me and considered in my medical decision making (see chart for details).    68 year old female presents with bilateral thoracic back pain.  I believe that this is musculoskeletal.  Patient is concerned that this is a medication side effect.  We will stop Cipro and Flagyl.  Start Augmentin.  Tramadol as needed for pain.  Supportive care.  Final Clinical Impressions(s) / UC Diagnoses   Final diagnoses:  Acute bilateral thoracic back pain     Discharge Instructions     Stop the antibiotics.  Start Augmentin.  Pain medication as needed.     ED Prescriptions    Medication Sig Dispense Auth. Provider    amoxicillin-clavulanate (AUGMENTIN) 875-125 MG tablet Take 1 tablet by mouth every 12 (twelve) hours. 14 tablet [place]ia Mincey G, DO   traMADol (ULTRAM) 50 MG tablet Take 1 tablet (50 mg total) by mouth every 8 (eight) hours as needed. 10 tablet [place],  Barnie Del, DO     Controlled Substance Prescriptions [place] Controlled Substance Registry consulted? Not Applicable   Coral Spikes, DO 12/14/18 1631

## 2019-05-25 ENCOUNTER — Other Ambulatory Visit: Payer: Self-pay | Admitting: Internal Medicine

## 2019-05-25 DIAGNOSIS — Z1231 Encounter for screening mammogram for malignant neoplasm of breast: Secondary | ICD-10-CM

## 2019-06-06 ENCOUNTER — Ambulatory Visit
Admission: EM | Admit: 2019-06-06 | Discharge: 2019-06-06 | Disposition: A | Discharge status: Home / Self Care | Payer: Medicare Other | Attending: Family Medicine | Admitting: Family Medicine

## 2019-06-06 ENCOUNTER — Other Ambulatory Visit: Payer: Self-pay

## 2019-06-06 DIAGNOSIS — R319 Hematuria, unspecified: Secondary | ICD-10-CM | POA: Diagnosis not present

## 2019-06-06 DIAGNOSIS — N390 Urinary tract infection, site not specified: Secondary | ICD-10-CM

## 2019-06-06 LAB — URINALYSIS, COMPLETE (UACMP) WITH MICROSCOPIC
Bacteria, UA: NONE SEEN
Bilirubin Urine: NEGATIVE
Glucose, UA: NEGATIVE mg/dL
Ketones, ur: NEGATIVE mg/dL
Nitrite: POSITIVE — AB
Protein, ur: NEGATIVE mg/dL
Specific Gravity, Urine: <1.005 — ABNORMAL LOW (ref 1.005–1.030)
pH: 5.5 (ref 5.0–8.0)

## 2019-06-06 MED ORDER — CEPHALEXIN 500 MG PO CAPS: 500.0000 mg | ORAL_CAPSULE | Freq: Two times a day (BID) | ORAL | 0 refills | Status: DC

## 2019-06-06 NOTE — ED Provider Notes (Signed)
[facility]    CSN: [id number] Arrival date & time: 06/06/19  [id number]      History   Chief Complaint Chief Complaint  Patient presents with  . Urinary Frequency    HPI Abigail Nguyen is a 68 y.o. female.   68 yo female with a c/o urinary frequency and burning with urination. Denies any fevers, chills, vomiting, flank pain, abdominal pain.    Urinary Frequency    Past Medical History:  Diagnosis Date  . Breast cyst   . Hypertension   . Hypothyroidism   . Thyroid disease     There are no active problems to display for this patient.   Past Surgical History:  Procedure Laterality Date  . ANKLE SURGERY    . APPENDECTOMY    . BREAST CYST ASPIRATION Right    neg  . CESAREAN SECTION    . CHOLECYSTECTOMY    . COLONOSCOPY WITH PROPOFOL N/A 02/23/2017   Procedure: COLONOSCOPY WITH PROPOFOL;  Surgeon: Manya Silvas, MD;  Location: [facility] ENDOSCOPY;  Service: Endoscopy;  Laterality: N/A;  . EYE SURGERY    . laparascopy N/A 1981   diagnostic  . TONSILLECTOMY      OB History   No obstetric history on file.      Home Medications    Prior to Admission medications   Medication Sig Start Date End Date Taking? Authorizing Provider  carvedilol (COREG) 3.125 MG tablet Take by mouth. 12/07/18 12/07/19 Yes [provider]  levothyroxine (SYNTHROID, LEVOTHROID) 25 MCG tablet Take 25 mcg by mouth daily before breakfast.   Yes [provider]  lisinopril (PRINIVIL,ZESTRIL) 10 MG tablet Take 10 mg by mouth daily.   Yes [provider]  amoxicillin-clavulanate (AUGMENTIN) 875-125 MG tablet Take 1 tablet by mouth every 12 (twelve) hours. 12/14/18   Coral Spikes, DO  calcium carbonate (TUMS - DOSED IN MG ELEMENTAL CALCIUM) 500 MG chewable tablet Chew 1 tablet by mouth daily.    [provider]  cephALEXin (KEFLEX) 500 MG capsule Take 1 capsule (500 mg total) by mouth 2 (two) times daily. 06/06/19   Norval Gable, MD  traMADol  (ULTRAM) 50 MG tablet Take 1 tablet (50 mg total) by mouth every 8 (eight) hours as needed. 12/14/18   Coral Spikes, DO    Family History Family History  Problem Relation Age of Onset  . Cancer Mother   . Coronary artery disease Father   . Breast cancer Maternal Grandmother 47    Social History Social History   Tobacco Use  . Smoking status: Never Smoker  . Smokeless tobacco: Never Used  Substance Use Topics  . Alcohol use: Yes    Comment: rarely  . Drug use: No     Allergies   Codeine, Compazine [prochlorperazine edisylate], and Morphine and related   Review of Systems Review of Systems  Genitourinary: Positive for frequency.     Physical Exam Triage Vital Signs ED Triage Vitals [06/06/19 0956]  Enc Vitals Group     BP 138/84     Pulse Rate 78     Resp 18     Temp 99.3 F (37.4 C)     Temp Source Oral     SpO2 100 %     Weight 190 lb (86.2 kg)     Height 5\' 4"  (1.626 m)     Head Circumference      Peak Flow      Pain Score 0  Pain Loc      Pain Edu?      Excl. in [place]?    No data found.  Updated Vital Signs BP 138/84 (BP Location: Left Arm)   Pulse 78   Temp 99.3 F (37.4 C) (Oral)   Resp 18   Ht 5\' 4"  (1.626 m)   Wt 86.2 kg   SpO2 100%   BMI 32.61 kg/m   Visual Acuity Right Eye Distance:   Left Eye Distance:   Bilateral Distance:    Right Eye Near:   Left Eye Near:    Bilateral Near:     Physical Exam Vitals signs and nursing note reviewed.  Constitutional:      General: She is not in acute distress.    Appearance: She is not toxic-appearing or diaphoretic.  Abdominal:     General: There is no distension.     Palpations: Abdomen is soft.  Neurological:     Mental Status: She is alert.      UC Treatments / Results  Labs (all labs ordered are listed, but only abnormal results are displayed) Labs Reviewed  URINALYSIS, COMPLETE (UACMP) WITH MICROSCOPIC - Abnormal; Notable for the following components:      Result Value    Specific Gravity, Urine <1.005 (*)    Hgb urine dipstick MODERATE (*)    Nitrite POSITIVE (*)    Leukocytes,Ua SMALL (*)    All other components within normal limits  URINE CULTURE    EKG   Radiology No results found.  Procedures Procedures (including critical care time)  Medications Ordered in UC Medications - No data to display  Initial Impression / Assessment and Plan / UC Course  I have reviewed the triage vital signs and the nursing notes.  Pertinent labs & imaging results that were available during my care of the patient were reviewed by me and considered in my medical decision making (see chart for details).      Final Clinical Impressions(s) / UC Diagnoses   Final diagnoses:  Urinary tract infection with hematuria, site unspecified     Discharge Instructions     Increase water intake    ED Prescriptions    Medication Sig Dispense Auth. Provider   cephALEXin (KEFLEX) 500 MG capsule Take 1 capsule (500 mg total) by mouth 2 (two) times daily. 14 capsule Norval Gable, MD      1. Labs/x-ray results and diagnosis reviewed with patient/parent/guardian/family 2. rx as per orders above; reviewed possible side effects, interactions, risks and benefits  3. Recommend supportive treatment as above  4. Follow-up prn if symptoms worsen or don't improve  PDMP not reviewed this encounter.   Norval Gable, MD 06/06/19 1040

## 2019-06-06 NOTE — ED Triage Notes (Signed)
Reports that since Saturday she has had urinary frequency, burning, and retention.  Pt alert and oriented X4, cooperative, RR even and unlabored, color WNL. Pt in NAD.

## 2019-06-06 NOTE — Discharge Instructions (Addendum)
Increase water intake

## 2019-06-08 LAB — URINE CULTURE
Culture: >=100000 — AB
Special Requests: NORMAL

## 2019-06-09 ENCOUNTER — Telehealth (HOSPITAL_COMMUNITY): Payer: Self-pay | Admitting: Emergency Medicine

## 2019-06-09 NOTE — Telephone Encounter (Signed)
Urine culture was positive for e coli and was given keflex  at urgent care visit. Attempted to reach patient. No answer at this time.   

## 2019-06-14 ENCOUNTER — Encounter: Payer: Self-pay | Admitting: Emergency Medicine

## 2019-06-14 ENCOUNTER — Ambulatory Visit
Admission: RE | Admit: 2019-06-14 | Discharge: 2019-06-14 | Disposition: A | Discharge status: Home / Self Care | Payer: Medicare Other | Source: Ambulatory Visit | Attending: Internal Medicine | Admitting: Internal Medicine

## 2019-06-14 ENCOUNTER — Ambulatory Visit
Admission: EM | Admit: 2019-06-14 | Discharge: 2019-06-14 | Disposition: A | Discharge status: Home / Self Care | Payer: Medicare Other | Attending: Emergency Medicine | Admitting: Emergency Medicine

## 2019-06-14 ENCOUNTER — Other Ambulatory Visit: Payer: Self-pay

## 2019-06-14 DIAGNOSIS — Z1231 Encounter for screening mammogram for malignant neoplasm of breast: Secondary | ICD-10-CM | POA: Insufficient documentation

## 2019-06-14 DIAGNOSIS — R300 Dysuria: Secondary | ICD-10-CM | POA: Diagnosis not present

## 2019-06-14 LAB — URINALYSIS, COMPLETE (UACMP) WITH MICROSCOPIC
Bacteria, UA: NONE SEEN
Bilirubin Urine: NEGATIVE
Glucose, UA: NEGATIVE mg/dL
Ketones, ur: NEGATIVE mg/dL
Leukocytes,Ua: NEGATIVE
Nitrite: NEGATIVE
Protein, ur: NEGATIVE mg/dL
Specific Gravity, Urine: 1.01 (ref 1.005–1.030)
pH: 6 (ref 5.0–8.0)

## 2019-06-14 MED ORDER — FLUCONAZOLE 150 MG PO TABS: 150.0000 mg | ORAL_TABLET | Freq: Every day | ORAL | 0 refills | Status: DC

## 2019-06-14 NOTE — ED Provider Notes (Signed)
[facility] ____________________________________________  Time seen: Approximately 10:38 AM  I have reviewed the triage vital signs and the nursing notes.   HISTORY  Chief Complaint Urinary Tract Infection   HPI Abigail Nguyen is a 68 y.o. female presenting for evaluation of 2 days of some discomfort with urination.  Patient was seen and treated for urinary tract infection on 9/21 with a positive E. coli culture, susceptible to Keflex which she was treated with.  Patient reports she completed the antibiotic but started noticed some mild discomfort with urination yesterday.  States the discomfort is a burning, and does have some burning when urine hits skin.  No change in urinary frequency or urgency.  Denies abdominal pain, back pain, fevers or other complaints.  Did take Azo yesterday which helped.  Reports otherwise doing well.  Denies renal insufficiency.  Kirk Ruths, MD: PCP    Past Medical History:  Diagnosis Date  . Breast cyst   . Hypertension   . Hypothyroidism   . Thyroid disease     There are no active problems to display for this patient.   Past Surgical History:  Procedure Laterality Date  . ANKLE SURGERY    . APPENDECTOMY    . BREAST CYST ASPIRATION Right    neg  . CESAREAN SECTION    . CHOLECYSTECTOMY    . COLONOSCOPY WITH PROPOFOL N/A 02/23/2017   Procedure: COLONOSCOPY WITH PROPOFOL;  Surgeon: Manya Silvas, MD;  Location: [facility] ENDOSCOPY;  Service: Endoscopy;  Laterality: N/A;  . EYE SURGERY    . laparascopy N/A 1981   diagnostic  . TONSILLECTOMY       No current facility-administered medications for this encounter.   Current Outpatient Medications:  .  calcium carbonate (TUMS - DOSED IN MG ELEMENTAL CALCIUM) 500 MG chewable tablet, Chew 1 tablet by mouth daily., Disp: , Rfl:  .  carvedilol (COREG) 3.125 MG tablet, Take by mouth., Disp: , Rfl:  .  levothyroxine (SYNTHROID, LEVOTHROID) 25 MCG tablet, Take 25 mcg by mouth  daily before breakfast., Disp: , Rfl:  .  amoxicillin-clavulanate (AUGMENTIN) 875-125 MG tablet, Take 1 tablet by mouth every 12 (twelve) hours., Disp: 14 tablet, Rfl: 0 .  cephALEXin (KEFLEX) 500 MG capsule, Take 1 capsule (500 mg total) by mouth 2 (two) times daily., Disp: 14 capsule, Rfl: 0 .  fluconazole (DIFLUCAN) 150 MG tablet, Take 1 tablet (150 mg total) by mouth daily. Take one pill orally once, Disp: 1 tablet, Rfl: 0 .  lisinopril (PRINIVIL,ZESTRIL) 10 MG tablet, Take 10 mg by mouth daily., Disp: , Rfl:  .  traMADol (ULTRAM) 50 MG tablet, Take 1 tablet (50 mg total) by mouth every 8 (eight) hours as needed., Disp: 10 tablet, Rfl: 0  Allergies Codeine, Compazine [prochlorperazine edisylate], and Morphine and related  Family History  Problem Relation Age of Onset  . Cancer Mother   . Coronary artery disease Father   . Breast cancer Maternal Grandmother 73    Social History Social History   Tobacco Use  . Smoking status: Never Smoker  . Smokeless tobacco: Never Used  Substance Use Topics  . Alcohol use: Yes    Comment: rarely  . Drug use: No    Review of Systems Constitutional: No fever ENT: No sore throat. Cardiovascular: Denies chest pain. Respiratory: Denies shortness of breath. Gastrointestinal: No abdominal pain.  Genitourinary: Positive for dysuria. Musculoskeletal: Negative for back pain. Skin: Negative for rash.   ____________________________________________   PHYSICAL EXAM:  VITAL  SIGNS: ED Triage Vitals  Enc Vitals Group     BP 06/14/19 0952 121/75     Pulse Rate 06/14/19 0952 71     Resp 06/14/19 0952 18     Temp 06/14/19 0952 98.8 F (37.1 C)     Temp src --      SpO2 06/14/19 0952 98 %     Weight --      Height --      Head Circumference --      Peak Flow --      Pain Score 06/14/19 0950 3     Pain Loc --      Pain Edu? --      Excl. in [place]? --     Constitutional: Alert and oriented. Well appearing and in no acute distress. Eyes:  Conjunctivae are normal. ENT      Head: Normocephalic and atraumatic. Cardiovascular: Normal rate, regular rhythm. Grossly normal heart sounds.  Good peripheral circulation. Respiratory: Normal respiratory effort without tachypnea nor retractions. Breath sounds are clear and equal bilaterally. No wheezes, rales, rhonchi. Gastrointestinal:  No CVA tenderness. Musculoskeletal: Steady. Neurologic:  Normal speech and language.Speech is normal. No gait instability.  Skin:  Skin is warm, dry and intact. No rash noted. Psychiatric: Mood and affect are normal. Speech and behavior are normal. Patient exhibits appropriate insight and judgment   ___________________________________________   LABS (all labs ordered are listed, but only abnormal results are displayed)  Labs Reviewed  URINALYSIS, COMPLETE (UACMP) WITH MICROSCOPIC - Abnormal; Notable for the following components:      Result Value   Hgb urine dipstick TRACE (*)    All other components within normal limits  URINE CULTURE   ____________________________________________   PROCEDURES Procedures    INITIAL IMPRESSION / ASSESSMENT AND PLAN / ED COURSE  Pertinent labs & imaging results that were available during my care of the patient were reviewed by me and considered in my medical decision making (see chart for details).  Well-appearing patient.  No acute distress.  Urinalysis reviewed, no clear UTI, just completed recent antibiotic therapy.  We will culture today's urine.  In further discussing with patient, expressed possible yeast vaginitis, will empirically treat with single Diflucan.  Counseled to increase fluids, monitor closely and will await urine culture.  Patient agrees with plan. Discussed indication, risks and benefits of medications with patient.  Discussed follow up with Primary care physician this week. Discussed follow up and return parameters including no resolution or any worsening concerns. Patient verbalized  understanding and agreed to plan.   ____________________________________________   FINAL CLINICAL IMPRESSION(S) / ED DIAGNOSES  Final diagnoses:  Dysuria     ED Discharge Orders         Ordered    fluconazole (DIFLUCAN) 150 MG tablet  Daily     06/14/19 1036           Note: This dictation was prepared with Dragon dictation along with smaller phrase technology. Any transcriptional errors that result from this process are unintentional.         Marylene Land, NP 06/14/19 1110

## 2019-06-14 NOTE — ED Triage Notes (Signed)
Pt was seen last Monday for UTI but states that she still has some discomfort. Burning sensation completed regiment

## 2019-06-14 NOTE — Discharge Instructions (Addendum)
Take medication as prescribed. Monitor. Drink plenty of fluids.   Follow up with your primary care physician this week as needed. Return to Urgent care for new or worsening concerns.

## 2019-06-15 LAB — URINE CULTURE: Culture: NO GROWTH

## 2019-07-01 ENCOUNTER — Encounter: Payer: Self-pay | Admitting: Emergency Medicine

## 2019-07-01 ENCOUNTER — Ambulatory Visit
Admission: EM | Admit: 2019-07-01 | Discharge: 2019-07-01 | Disposition: A | Discharge status: Home / Self Care | Payer: Medicare Other | Attending: Emergency Medicine | Admitting: Emergency Medicine

## 2019-07-01 ENCOUNTER — Other Ambulatory Visit: Payer: Self-pay

## 2019-07-01 DIAGNOSIS — B9689 Other specified bacterial agents as the cause of diseases classified elsewhere: Secondary | ICD-10-CM

## 2019-07-01 DIAGNOSIS — N760 Acute vaginitis: Secondary | ICD-10-CM | POA: Diagnosis not present

## 2019-07-01 LAB — URINALYSIS, COMPLETE (UACMP) WITH MICROSCOPIC
Bacteria, UA: NONE SEEN
Bilirubin Urine: NEGATIVE
Glucose, UA: NEGATIVE mg/dL
Ketones, ur: NEGATIVE mg/dL
Nitrite: NEGATIVE
Protein, ur: NEGATIVE mg/dL
RBC / HPF: NONE SEEN RBC/hpf (ref 0–5)
Specific Gravity, Urine: 1.015 (ref 1.005–1.030)
Squamous Epithelial / HPF: NONE SEEN (ref 0–5)
pH: 5.5 (ref 5.0–8.0)

## 2019-07-01 LAB — WET PREP, GENITAL
Sperm: NONE SEEN
Trich, Wet Prep: NONE SEEN
WBC, Wet Prep HPF POC: NONE SEEN
Yeast Wet Prep HPF POC: NONE SEEN

## 2019-07-01 MED ORDER — METRONIDAZOLE 500 MG PO TABS: 500.0000 mg | ORAL_TABLET | Freq: Two times a day (BID) | ORAL | 0 refills | Status: AC

## 2019-07-01 NOTE — ED Triage Notes (Addendum)
Patient in today c/o continued dysuria, urinary frequency and malodorous urine. Patient has been seen twice previously for this and states that she feels like she never got completely well. Patient states she has had a slight headache and occasional chill. Patient denies any vaginal discharge.

## 2019-07-01 NOTE — Discharge Instructions (Signed)
Your wet prep came back positive for bacterial vaginosis, which I suspect is what is causing your symptoms.  I am treating this with Flagyl for a week.  I am sending your urine off for culture to make sure that you do not have a urinary tract infection.  If it comes back positive for infection, we will notify you and call in the appropriate antibiotic.  In the meantime, continue pushing fluids.

## 2019-07-01 NOTE — ED Provider Notes (Signed)
HPI  SUBJECTIVE:  Abigail Nguyen is a 68 y.o. female who presents with a continued urinary symptoms that have been present since 9/21.  She reports odorous, cloudy urine especially in the early morning and in the evening. she reports continued dysuria, urgency, frequency.  She reports intermittent nonmigratory, nonradiating low midline abdominal "discomfort" that lasts minutes. It is not associated with urination.  No aggravating factors.  No Alleviating factors.  It resolves on its own.  She denies hematuria, fevers, body aches, back, pelvic pain.  No vaginal odor, discharge, itching, genital rash or blisters.  She is sexually active with her husband of 23 years who is asymptomatic.  STDs are not a concern today.  She has tried Azo, Keflex, Diflucan and pushing fluids.  She states that the Keflex and Diflucan seemed to help for a while.  No aggravating factors.  She has a history of frequent UTIs, pyelonephritis.  No history of nephrolithiasis, diabetes, interstitial cystitis.  She has remote history of BV and yeast infections.  No history of atrophic vaginitis, renal insufficiency.  PMD: Dr. Frazier Richards.  She has an appointment with [facility] on 10/22 for pelvic exam.  Urology: None-was evaluated in the 70s by a urologist for frequent UTIs, had a cystoscopy which showed bladder irritation.  She was placed on 10 days of antibiotics and 6 months of Bactrim.  She was seen here on 9/21, found to have an E. coli UTI which was susceptible to Keflex that she was treated with.  She returned on 9/29 with dysuria, but her urine culture was negative.  It was thought that she could have yeast vaginitis, and was treated with a single Diflucan.  Past Medical History:  Diagnosis Date  . Breast cyst   . Hypertension   . Hypothyroidism   . Thyroid disease     Past Surgical History:  Procedure Laterality Date  . ANKLE SURGERY    . APPENDECTOMY    . BREAST CYST ASPIRATION Right    neg  .  CESAREAN SECTION    . CHOLECYSTECTOMY    . COLONOSCOPY WITH PROPOFOL N/A 02/23/2017   Procedure: COLONOSCOPY WITH PROPOFOL;  Surgeon: Manya Silvas, MD;  Location: [facility] ENDOSCOPY;  Service: Endoscopy;  Laterality: N/A;  . EYE SURGERY    . laparascopy N/A 1981   diagnostic  . TONSILLECTOMY      Family History  Problem Relation Age of Onset  . Colon cancer Mother   . Coronary artery disease Father   . Stroke Father   . Breast cancer Maternal Grandmother 63    Social History   Tobacco Use  . Smoking status: Never Smoker  . Smokeless tobacco: Never Used  Substance Use Topics  . Alcohol use: Yes    Comment: rarely  . Drug use: No    No current facility-administered medications for this encounter.   Current Outpatient Medications:  .  calcium carbonate (TUMS - DOSED IN MG ELEMENTAL CALCIUM) 500 MG chewable tablet, Chew 1 tablet by mouth daily., Disp: , Rfl:  .  carvedilol (COREG) 3.125 MG tablet, Take by mouth., Disp: , Rfl:  .  levothyroxine (SYNTHROID, LEVOTHROID) 25 MCG tablet, Take 25 mcg by mouth daily before breakfast., Disp: , Rfl:  .  lisinopril (PRINIVIL,ZESTRIL) 10 MG tablet, Take 10 mg by mouth daily., Disp: , Rfl:  .  metroNIDAZOLE (FLAGYL) 500 MG tablet, Take 1 tablet (500 mg total) by mouth 2 (two) times daily for 7 days., Disp: 14 tablet, Rfl:  0  Allergies  Allergen Reactions  . Codeine Other (See Comments)    Pt said that she passed out from taking Codeine   . Compazine [Prochlorperazine Edisylate] Nausea And Vomiting  . Morphine And Related Nausea And Vomiting     ROS  As noted in HPI.   Physical Exam  BP 121/78 (BP Location: Left Arm)   Pulse 71   Temp 98.6 F (37 C) (Oral)   Resp 18   Ht 5\' 4"  (1.626 m)   Wt 86.2 kg   SpO2 100%   BMI 32.61 kg/m   Constitutional: Well developed, well nourished, no acute distress Eyes:  EOMI, conjunctiva normal bilaterally HENT: Normocephalic, atraumatic,mucus membranes moist Respiratory: Normal  inspiratory effort Cardiovascular: Normal rate GI: nondistended.  Soft.  Active bowel sounds.  Mild right flank tenderness.  No guarding, rebound.  No suprapubic tenderness. Back: No CVAT bilaterally skin: No rash, skin intact Musculoskeletal: no deformities Neurologic: Alert & oriented x 3, no focal neuro deficits Psychiatric: Speech and behavior appropriate   ED Course   Medications - No data to display  Orders Placed This Encounter  Procedures  . Wet prep, genital    Standing Status:   Standing    Number of Occurrences:   1  . Urine culture    Standing Status:   Standing    Number of Occurrences:   1    Order Specific Question:   Patient immune status    Answer:   Normal  . Urinalysis, Complete w Microscopic    Standing Status:   Standing    Number of Occurrences:   1    Results for orders placed or performed during the hospital encounter of 07/01/19 (from the past 24 hour(s))  Urinalysis, Complete w Microscopic     Status: Abnormal   Collection Time: 07/01/19  1:15 PM  Result Value Ref Range   Color, Urine YELLOW YELLOW   APPearance CLEAR CLEAR   Specific Gravity, Urine 1.015 1.005 - 1.030   pH 5.5 5.0 - 8.0   Glucose, UA NEGATIVE NEGATIVE mg/dL   Hgb urine dipstick TRACE (A) NEGATIVE   Bilirubin Urine NEGATIVE NEGATIVE   Ketones, ur NEGATIVE NEGATIVE mg/dL   Protein, ur NEGATIVE NEGATIVE mg/dL   Nitrite NEGATIVE NEGATIVE   Leukocytes,Ua TRACE (A) NEGATIVE   Squamous Epithelial / LPF NONE SEEN 0 - 5   WBC, UA 21-50 0 - 5 WBC/hpf   RBC / HPF NONE SEEN 0 - 5 RBC/hpf   Bacteria, UA NONE SEEN NONE SEEN   WBC Clumps PRESENT   Wet prep, genital     Status: Abnormal   Collection Time: 07/01/19  1:38 PM   Specimen: Vaginal; Genital  Result Value Ref Range   Yeast Wet Prep HPF POC NONE SEEN NONE SEEN   Trich, Wet Prep NONE SEEN NONE SEEN   Clue Cells Wet Prep HPF POC PRESENT (A) NONE SEEN   WBC, Wet Prep HPF POC NONE SEEN NONE SEEN   Sperm NONE SEEN    No  results found.  ED Clinical Impression  1. BV (bacterial vaginosis)      ED Assessment/Plan  Previous records, labs reviewed.  As noted in HPI.  Trace hematuria, leukocytes.  Positive white blood cells.  Negative nitrite, bacteria.  This is not entirely consistent with a UTI, will send this off for culture.  Will obtain wet prep.  Wet prep positive for BV.  will treat this with Flagyl for 7 days.  Withholding  antibiotics for UTI pending urine culture results.  Discussed labs, MDM, treatment plan, and plan for follow-up with patient. Discussed sn/sx that should prompt return to the ED. patient agrees with plan.   Meds ordered this encounter  Medications  . metroNIDAZOLE (FLAGYL) 500 MG tablet    Sig: Take 1 tablet (500 mg total) by mouth 2 (two) times daily for 7 days.    Dispense:  14 tablet    Refill:  0    *This clinic note was created using [employer]. Therefore, there may be occasional mistakes despite careful proofreading.   ?    Melynda Ripple, MD 07/02/19 [phone]

## 2019-07-04 ENCOUNTER — Telehealth (HOSPITAL_COMMUNITY): Payer: Self-pay | Admitting: Emergency Medicine

## 2019-07-04 LAB — URINE CULTURE
Culture: >=100000 — AB
Special Requests: NORMAL

## 2019-07-04 MED ORDER — CEPHALEXIN 500 MG PO CAPS: 500.0000 mg | ORAL_CAPSULE | Freq: Two times a day (BID) | ORAL | 0 refills | Status: AC

## 2019-07-04 NOTE — Telephone Encounter (Signed)
Not treated, reviewed with dr. Lanny Cramp, will send keflex BID x7 days. Patient contacted and made aware of    results, all questions answered

## 2020-01-11 ENCOUNTER — Ambulatory Visit
Admission: EM | Admit: 2020-01-11 | Discharge: 2020-01-11 | Disposition: A | Discharge status: Home / Self Care | Payer: Medicare PPO | Attending: Family Medicine | Admitting: Family Medicine

## 2020-01-11 ENCOUNTER — Other Ambulatory Visit: Payer: Self-pay

## 2020-01-11 DIAGNOSIS — L03211 Cellulitis of face: Secondary | ICD-10-CM

## 2020-01-11 MED ORDER — AMOXICILLIN-POT CLAVULANATE 875-125 MG PO TABS: 1.0000 | ORAL_TABLET | Freq: Two times a day (BID) | ORAL | 0 refills | Status: DC

## 2020-01-11 MED ORDER — MUPIROCIN 2 % EX OINT: TOPICAL_OINTMENT | CUTANEOUS | 0 refills | Status: DC

## 2020-01-11 NOTE — ED Provider Notes (Signed)
[facility] ____________________________________________  Time seen: Approximately 11:10 AM  I have reviewed the triage vital signs and the nursing notes.   HISTORY  Chief Complaint Facial Swelling  HPI Abigail Nguyen is a 69 y.o. female presenting for evaluation of left cheek redness and mild swelling.  Patient reports she has a small bump that is chronically there, and reports as she has been wearing a mask it has rubbed the area causing it to become dry.  States last Thursday and Friday she noticed the area around that was starting to get red and reports the redness has gradually increased and now has some swelling.  States the area is minimally tender, denies significant pain.  Denies any drainage.  Denies any vision changes, pain or difficulty with eye movement.  Denies abrupt onset.  No history of the same.  Denies recent antibiotic use.  Denies renal insufficiency.  Denies fevers, cough or recent sickness.  Reports otherwise doing well.  Kirk Ruths, MD : PCP    Past Medical History:  Diagnosis Date  . Breast cyst   . Hypertension   . Hypothyroidism   . Thyroid disease     There are no problems to display for this patient.   Past Surgical History:  Procedure Laterality Date  . ANKLE SURGERY    . APPENDECTOMY    . BREAST CYST ASPIRATION Right    neg  . CESAREAN SECTION    . CHOLECYSTECTOMY    . COLONOSCOPY WITH PROPOFOL N/A 02/23/2017   Procedure: COLONOSCOPY WITH PROPOFOL;  Surgeon: Manya Silvas, MD;  Location[facility] END[facility]COPY;  Service: Endoscopy;  Laterality: N/A;  . EYE SURGERY    . laparascopy N/A 1981   diagnostic  . TONSILLECTOMY       No current facility-administered medications for this encounter.  Current Outpatient Medications:  .  calcium carbonate (TUMS - DOSED IN MG ELEMENTAL CALCIUM) 500 MG chewable tablet, Chew 1 tablet by mouth daily., Disp: , Rfl:  .  carvedilol (COREG) 3.125 MG tablet, Take by mouth., Disp: , Rfl:   .  levothyroxine (SYNTHROID, LEVOTHROID) 25 MCG tablet, Take 25 mcg by mouth daily before breakfast., Disp: , Rfl:  .  lisinopril (PRINIVIL,ZESTRIL) 10 MG tablet, Take 10 mg by mouth daily., Disp: , Rfl:  .  amoxicillin-clavulanate (AUGMENTIN) 875-125 MG tablet, Take 1 tablet by mouth every 12 (twelve) hours., Disp: 20 tablet, Rfl: 0 .  mupirocin ointment (BACTROBAN) 2 %, Apply two times a day for 7 days., Disp: 22 g, Rfl: 0  Allergies Codeine, Compazine [prochlorperazine edisylate], and Morphine and related  Family History  Problem Relation Age of Onset  . Colon cancer Mother   . Coronary artery disease Father   . Stroke Father   . Breast cancer Maternal Grandmother 62    Social History Social History   Tobacco Use  . Smoking status: Never Smoker  . Smokeless tobacco: Never Used  Substance Use Topics  . Alcohol use: Yes    Comment: rarely  . Drug use: No    Review of Systems Constitutional: No fever Eyes: No visual changes. ENT: No sore throat. Cardiovascular: Denies chest pain. Respiratory: Denies shortness of breath. Skin: Positive for rash.  ____________________________________________   PHYSICAL EXAM:  VITAL SIGNS: ED Triage Vitals  Enc Vitals Group     BP 01/11/20 1003 139/86     Pulse Rate 01/11/20 1003 71     Resp 01/11/20 1003 18     Temp 01/11/20 1003 98.5  F (36.9 C)     Temp Source 01/11/20 1003 Oral     SpO2 01/11/20 1003 100 %     Weight 01/11/20 1001 185 lb (83.9 kg)     Height 01/11/20 1001 5\' 4"  (1.626 m)     Head Circumference --      Peak Flow --      Pain Score 01/11/20 1001 1     Pain Loc --      Pain Edu? --      Excl. in [place]? --     Constitutional: Alert and oriented. Well appearing and in no acute distress. Eyes: Conjunctivae are normal. PERRL. EOMI. no pain with EOMs. ENT      Head: Normocephalic and atraumatic. Cardiovascular:  Good peripheral circulation. Respiratory: Normal respiratory effort without tachypnea nor  retractions.  Musculoskeletal: Steady gait. Neurologic:  Normal speech and language.Speech is normal. No gait instability.  Skin:  Skin is warm, dry.  Except: Left cheek along maxilla approximately 3 x 2 cm area of erythema with minimal swelling, minimal tenderness, no fluctuance, no induration, no drainage, no further surrounding erythema. Psychiatric: Mood and affect are normal. Speech and behavior are normal. Patient exhibits appropriate insight and judgment   ___________________________________________   LABS (all labs ordered are listed, but only abnormal results are displayed)  Labs Reviewed - No data to display ____________________________________________   PROCEDURES Procedures    INITIAL IMPRESSION / ASSESSMENT AND PLAN / ED COURSE  Pertinent labs & imaging results that were available during my care of the patient were reviewed by me and considered in my medical decision making (see chart for details).  Well-appearing patient.  No acute distress.  Left facial cellulitis.  Will treat with oral Augmentin and topical Bactroban.  Discussed closely monitoring. Discussed indication, risks and benefits of medications with patient.   Discussed follow up with Primary care physician this week. Discussed follow up and return parameters including no resolution or any worsening concerns. Patient verbalized understanding and agreed to plan.   ____________________________________________   FINAL CLINICAL IMPRESSION(S) / ED DIAGNOSES  Final diagnoses:  Facial cellulitis     ED Discharge Orders         Ordered    amoxicillin-clavulanate (AUGMENTIN) 875-125 MG tablet  Every 12 hours     01/11/20 1025    mupirocin ointment (BACTROBAN) 2 %     01/11/20 1025           Note: This dictation was prepared with Dragon dictation along with smaller phrase technology. Any transcriptional errors that result from this process are unintentional.         Marylene Land, NP 01/11/20  1117

## 2020-01-11 NOTE — ED Triage Notes (Signed)
Patient states that she has left facial swelling under her eye that started on Thursday or Friday. Patient states that the area has been tender and swelling.

## 2020-01-11 NOTE — Discharge Instructions (Addendum)
Take medication as prescribed. Keep clean. Avoid rubbing. Cool compresses as needed.   Follow up with your primary care physician this week as needed. Return to Urgent care for new or worsening concerns.

## 2020-05-01 ENCOUNTER — Other Ambulatory Visit: Payer: Self-pay | Admitting: Internal Medicine

## 2020-05-01 DIAGNOSIS — Z1231 Encounter for screening mammogram for malignant neoplasm of breast: Secondary | ICD-10-CM

## 2020-06-19 ENCOUNTER — Ambulatory Visit: Payer: Medicare PPO

## 2020-06-27 ENCOUNTER — Other Ambulatory Visit: Payer: Self-pay

## 2020-06-27 ENCOUNTER — Ambulatory Visit
Admission: RE | Admit: 2020-06-27 | Discharge: 2020-06-27 | Disposition: A | Discharge status: Home / Self Care | Payer: Medicare PPO | Source: Ambulatory Visit | Attending: Internal Medicine | Admitting: Internal Medicine

## 2020-06-27 ENCOUNTER — Ambulatory Visit: Payer: Medicare PPO

## 2020-06-27 DIAGNOSIS — Z1231 Encounter for screening mammogram for malignant neoplasm of breast: Secondary | ICD-10-CM

## 2020-07-04 ENCOUNTER — Other Ambulatory Visit
Admission: RE | Admit: 2020-07-04 | Discharge: 2020-07-04 | Disposition: A | Discharge status: Home / Self Care | Payer: Medicare PPO | Source: Ambulatory Visit | Attending: Internal Medicine | Admitting: Internal Medicine

## 2020-07-04 DIAGNOSIS — R55 Syncope and collapse: Secondary | ICD-10-CM | POA: Diagnosis present

## 2020-07-04 LAB — TROPONIN I (HIGH SENSITIVITY): Troponin I (High Sensitivity): 3 ng/L (ref <18)

## 2020-07-04 LAB — FIBRIN DERIVATIVES D-DIMER (ARMC ONLY): Fibrin derivatives D-dimer (ARMC): 4412.84 ng/mL (FEU) — ABNORMAL HIGH (ref 0.00–499.00)

## 2020-07-05 ENCOUNTER — Other Ambulatory Visit: Payer: Self-pay | Admitting: Internal Medicine

## 2020-07-05 ENCOUNTER — Other Ambulatory Visit
Admission: RE | Admit: 2020-07-05 | Discharge: 2020-07-05 | Disposition: A | Discharge status: Home / Self Care | Payer: Medicare PPO | Source: Home / Self Care | Attending: Internal Medicine | Admitting: Internal Medicine

## 2020-07-05 ENCOUNTER — Ambulatory Visit
Admission: RE | Admit: 2020-07-05 | Discharge: 2020-07-05 | Disposition: A | Discharge status: Home / Self Care | Payer: Medicare PPO | Source: Ambulatory Visit | Attending: Internal Medicine | Admitting: Internal Medicine

## 2020-07-05 ENCOUNTER — Other Ambulatory Visit (HOSPITAL_COMMUNITY): Payer: Self-pay | Admitting: Internal Medicine

## 2020-07-05 ENCOUNTER — Other Ambulatory Visit: Payer: Self-pay

## 2020-07-05 DIAGNOSIS — R7989 Other specified abnormal findings of blood chemistry: Secondary | ICD-10-CM | POA: Insufficient documentation

## 2020-07-05 DIAGNOSIS — N6320 Unspecified lump in the left breast, unspecified quadrant: Secondary | ICD-10-CM

## 2020-07-05 DIAGNOSIS — R928 Other abnormal and inconclusive findings on diagnostic imaging of breast: Secondary | ICD-10-CM

## 2020-07-05 LAB — CREATININE, SERUM
Creatinine, Ser: 0.8 mg/dL (ref 0.44–1.00)
GFR, Estimated: >60 mL/min (ref >60)

## 2020-07-05 MED ORDER — IOHEXOL 350 MG/ML SOLN
100.0000 mL | Freq: Once | INTRAVENOUS | Status: AC | PRN
  Administered 2020-07-05: 100 mL via INTRAVENOUS

## 2020-07-13 ENCOUNTER — Other Ambulatory Visit: Payer: Self-pay

## 2020-07-13 ENCOUNTER — Ambulatory Visit
Admission: RE | Admit: 2020-07-13 | Discharge: 2020-07-13 | Disposition: A | Discharge status: Home / Self Care | Payer: Medicare PPO | Source: Ambulatory Visit | Attending: Internal Medicine | Admitting: Internal Medicine

## 2020-07-13 DIAGNOSIS — R928 Other abnormal and inconclusive findings on diagnostic imaging of breast: Secondary | ICD-10-CM

## 2020-07-13 DIAGNOSIS — N6320 Unspecified lump in the left breast, unspecified quadrant: Secondary | ICD-10-CM | POA: Insufficient documentation

## 2021-01-16 ENCOUNTER — Ambulatory Visit
Admission: RE | Admit: 2021-01-16 | Discharge: 2021-01-16 | Disposition: A | Discharge status: Home / Self Care | Payer: Medicare PPO | Source: Ambulatory Visit | Attending: Family Medicine | Admitting: Family Medicine

## 2021-01-16 ENCOUNTER — Other Ambulatory Visit: Payer: Self-pay

## 2021-01-16 VITALS — BP 157/89 | HR 88 | Temp 99.6°F | Resp 19

## 2021-01-16 DIAGNOSIS — B9620 Unspecified Escherichia coli [E. coli] as the cause of diseases classified elsewhere: Secondary | ICD-10-CM | POA: Diagnosis not present

## 2021-01-16 DIAGNOSIS — N3001 Acute cystitis with hematuria: Secondary | ICD-10-CM | POA: Diagnosis present

## 2021-01-16 DIAGNOSIS — R059 Cough, unspecified: Secondary | ICD-10-CM | POA: Insufficient documentation

## 2021-01-16 DIAGNOSIS — J069 Acute upper respiratory infection, unspecified: Secondary | ICD-10-CM | POA: Diagnosis present

## 2021-01-16 DIAGNOSIS — Z20822 Contact with and (suspected) exposure to covid-19: Secondary | ICD-10-CM | POA: Diagnosis not present

## 2021-01-16 LAB — URINALYSIS, COMPLETE (UACMP) WITH MICROSCOPIC
Bilirubin Urine: NEGATIVE
Glucose, UA: NEGATIVE mg/dL
Ketones, ur: NEGATIVE mg/dL
Nitrite: NEGATIVE
Protein, ur: NEGATIVE mg/dL
Specific Gravity, Urine: <1.005 — ABNORMAL LOW (ref 1.005–1.030)
WBC, UA: >50 WBC/hpf (ref 0–5)
pH: 5.5 (ref 5.0–8.0)

## 2021-01-16 MED ORDER — BENZONATATE 100 MG PO CAPS: 200.0000 mg | ORAL_CAPSULE | Freq: Three times a day (TID) | ORAL | 0 refills | Status: DC | PRN

## 2021-01-16 MED ORDER — IPRATROPIUM BROMIDE 0.06 % NA SOLN: 2.0000 | Freq: Four times a day (QID) | NASAL | 12 refills | Status: DC

## 2021-01-16 MED ORDER — CEFDINIR 300 MG PO CAPS: 300.0000 mg | ORAL_CAPSULE | Freq: Two times a day (BID) | ORAL | 0 refills | Status: AC

## 2021-01-16 NOTE — ED Triage Notes (Addendum)
Pt presents with complaints of nasal congestion, chest congestion, and cough since Monday. Reports concern for sinus infection. Patient has used otc medication with some relief. Reports cough has been worse today.  Pt also endorses concern for UTI. Reports strong odor in her urine.

## 2021-01-16 NOTE — Discharge Instructions (Addendum)
URI/COLD SYMPTOMS: Your exam today is consistent with a viral illness. Antibiotics are not indicated at this time. Use medications as directed, including cough syrup, nasal saline, and decongestants. Your symptoms should improve over the next few days and resolve within 7-10 days. Increase rest and fluids. F/u if symptoms worsen or predominate such as sore throat, ear pain, productive cough, shortness of breath, or if you develop high fevers or worsening fatigue over the next several days.    UTI: Based on either symptoms or urinalysis, you may have a urinary tract infection. We will send the urine for culture and call with results in a few days. Begin antibiotics at this time. Your symptoms should be much improved over the next 2-3 days. Increase rest and fluid intake. If for some reason symptoms are worsening or not improving after a couple of days or the urine culture determines the antibiotics you are taking will not treat the infection, the antibiotics may be changed. Return or go to ER for fever, back pain, worsening urinary pain, discharge, increased blood in urine. May take Tylenol or Motrin OTC for pain relief or consider AZO if no contraindications   You have received COVID testing today either for positive exposure, concerning symptoms that could be related to COVID infection, screening purposes, or re-testing after confirmed positive.  Your test obtained today checks for active viral infection in the last 1-2 weeks. If your test is negative now, you can still test positive later. So, if you do develop symptoms you should either get re-tested and/or isolate x 5 days and then strict mask use x 5 days (unvaccinated) or mask use x 10 days (vaccinated). Please follow CDC guidelines.  While Rapid antigen tests come back in 15-20 minutes, send out PCR/molecular test results typically come back within 1-3 days. In the mean time, if you are symptomatic, assume this could be a positive test and  treat/monitor yourself as if you do have COVID.   We will call with test results if positive. Please download the MyChart app and set up a profile to access test results.   If symptomatic, go home and rest. Push fluids. Take Tylenol as needed for discomfort. Gargle warm salt water. Throat lozenges. Take Mucinex DM or Robitussin for cough. Humidifier in bedroom to ease coughing. Warm showers. Also review the COVID handout for more information.  COVID-19 INFECTION: The incubation period of COVID-19 is approximately 14 days after exposure, with most symptoms developing in roughly 4-5 days. Symptoms may range in severity from mild to critically severe. Roughly 80% of those infected will have mild symptoms. People of any age may become infected with COVID-19 and have the ability to transmit the virus. The most common symptoms include: fever, fatigue, cough, body aches, headaches, sore throat, nasal congestion, shortness of breath, nausea, vomiting, diarrhea, changes in smell and/or taste.    COURSE OF ILLNESS Some patients may begin with mild disease which can progress quickly into critical symptoms. If your symptoms are worsening please call ahead to the Emergency Department and proceed there for further treatment. Recovery time appears to be roughly 1-2 weeks for mild symptoms and 3-6 weeks for severe disease.   GO IMMEDIATELY TO ER FOR FEVER YOU ARE UNABLE TO GET DOWN WITH TYLENOL, BREATHING PROBLEMS, CHEST PAIN, FATIGUE, LETHARGY, INABILITY TO EAT OR DRINK, ETC  QUARANTINE AND ISOLATION: To help decrease the spread of COVID-19 please remain isolated if you have COVID infection or are highly suspected to have COVID infection. This means -  stay home and isolate to one room in the home if you live with others. Do not share a bed or bathroom with others while ill, sanitize and wipe down all countertops and keep common areas clean and disinfected. Stay home for 5 days. If you have no symptoms or your symptoms  are resolving after 5 days, you can leave your house. Continue to wear a mask around others for 5 additional days. If you have been in close contact (within 6 feet) of someone diagnosed with COVID 19, you are advised to quarantine in your home for 14 days as symptoms can develop anywhere from 2-14 days after exposure to the virus. If you develop symptoms, you  must isolate.  Most current guidelines for COVID after exposure -unvaccinated: isolate 5 days and strict mask use x 5 days. Test on day 5 is possible -vaccinated: wear mask x 10 days if symptoms do not develop -You do not necessarily need to be tested for COVID if you have + exposure and  develop symptoms. Just isolate at home x10 days from symptom onset During this global pandemic, CDC advises to practice social distancing, try to stay at least 78f away from others at all times. Wear a face covering. Wash and sanitize your hands regularly and avoid going anywhere that is not necessary.  KEEP IN MIND THAT THE COVID TEST IS NOT 100% ACCURATE AND YOU SHOULD STILL DO EVERYTHING TO PREVENT POTENTIAL SPREAD OF VIRUS TO OTHERS (WEAR MASK, WEAR GLOVES, [place]S AND SANITIZE REGULARLY). IF INITIAL TEST IS NEGATIVE, THIS MAY NOT MEAN YOU ARE DEFINITELY NEGATIVE. MOST ACCURATE TESTING IS DONE 5-7 DAYS AFTER EXPOSURE.   It is not advised by CDC to get re-tested after receiving a positive COVID test since you can still test positive for weeks to months after you have already cleared the virus.   *If you have not been vaccinated for COVID, I strongly suggest you consider getting vaccinated as long as there are no contraindications.

## 2021-01-16 NOTE — ED Provider Notes (Signed)
[facility]    CSN: [id number] Arrival date & time: 01/16/21  1642      History   Chief Complaint Chief Complaint  Patient presents with  . Appointment  . Cough    HPI Abigail Nguyen is a 70 y.o. female presenting for 2-day history of headaches, fatigue, nasal congestion/runny nose, sinus pressure and cough that is occasionally productive.  Patient states that she recently went out of town to see her relatives and they all had cough.  She denies any known COVID exposure and has been vaccinated for COVID-19 x3.  Patient is mostly concerned about possible sinus infection.  She has been taking over-the-counter Mucinex, Coricidin, and using Flonase.  She denies any fevers, chest pain or breathing difficulty.   Additionally, she does report malodorous urine for the past couple of days.  Patient states that it initially hurt when she urinated and she had frequency and urgency.  Patient states that she increased her water intake dramatically and her symptoms have improved a little bit but she still concerned about possible UTI.  No abdominal pain or back pain.  No hematuria.  No other concerns.  HPI  Past Medical History:  Diagnosis Date  . Breast cyst   . Hypertension   . Hypothyroidism   . Thyroid disease     There are no problems to display for this patient.   Past Surgical History:  Procedure Laterality Date  . ANKLE SURGERY    . APPENDECTOMY    . BREAST CYST ASPIRATION Right 1980's   neg  . CESAREAN SECTION    . CHOLECYSTECTOMY    . COLONOSCOPY WITH PROPOFOL N/A 02/23/2017   Procedure: COLONOSCOPY WITH PROPOFOL;  Surgeon: Manya Silvas, MD;  Location: [facility] ENDOSCOPY;  Service: Endoscopy;  Laterality: N/A;  . EYE SURGERY    . laparascopy N/A 1981   diagnostic  . TONSILLECTOMY      OB History   No obstetric history on file.      Home Medications    Prior to Admission medications   Medication Sig Start Date End Date Taking? Authorizing Provider   benzonatate (TESSALON) 100 MG capsule Take 2 capsules (200 mg total) by mouth 3 (three) times daily as needed for cough. 01/16/21  Yes Danton Clap, PA-C  cefdinir (OMNICEF) 300 MG capsule Take 1 capsule (300 mg total) by mouth 2 (two) times daily for 7 days. 01/16/21 01/23/21 Yes Laurene Footman B, PA-C  ipratropium (ATROVENT) 0.06 % nasal spray Place 2 sprays into both nostrils 4 (four) times daily. 01/16/21  Yes Laurene Footman B, PA-C  calcium carbonate (TUMS - DOSED IN MG ELEMENTAL CALCIUM) 500 MG chewable tablet Chew 1 tablet by mouth daily.    [provider]  carvedilol (COREG) 3.125 MG tablet Take by mouth. 12/07/18 01/11/20  [provider]  levothyroxine (SYNTHROID, LEVOTHROID) 25 MCG tablet Take 25 mcg by mouth daily before breakfast.    [provider]  lisinopril (PRINIVIL,ZESTRIL) 10 MG tablet Take 10 mg by mouth daily.    [provider]  mupirocin ointment (BACTROBAN) 2 % Apply two times a day for 7 days. 01/11/20   Marylene Land, NP    Family History Family History  Problem Relation Age of Onset  . Colon cancer Mother   . Coronary artery disease Father   . Stroke Father   . Breast cancer Maternal Grandmother 83    Social History Social History   Tobacco Use  . Smoking status: Never  Smoker  . Smokeless tobacco: Never Used  Vaping Use  . Vaping Use: Never used  Substance Use Topics  . Alcohol use: Yes    Comment: rarely  . Drug use: No     Allergies   Codeine, Compazine [prochlorperazine edisylate], and Morphine and related   Review of Systems Review of Systems  Constitutional: Positive for fatigue. Negative for chills, diaphoresis and fever.  HENT: Positive for congestion, rhinorrhea and sinus pressure. Negative for ear pain, sinus pain and sore throat.   Respiratory: Positive for cough. Negative for shortness of breath.   Cardiovascular: Negative for chest pain.  Gastrointestinal: Negative for abdominal pain, nausea and  vomiting.  Genitourinary: Negative for difficulty urinating, dysuria, frequency and hematuria.       Malodorous urine  Musculoskeletal: Negative for arthralgias and myalgias.  Skin: Negative for rash.  Neurological: Positive for headaches. Negative for weakness.  Hematological: Negative for adenopathy.     Physical Exam Triage Vital Signs ED Triage Vitals  Enc Vitals Group     BP 01/16/21 1659 (!) 157/89     Pulse Rate 01/16/21 1659 88     Resp 01/16/21 1659 19     Temp 01/16/21 1659 99.6 F (37.6 C)     Temp src --      SpO2 01/16/21 1659 100 %     Weight --      Height --      Head Circumference --      Peak Flow --      Pain Score 01/16/21 1654 6     Pain Loc --      Pain Edu? --      Excl. in GC? --    No data found.  Updated Vital Signs BP (!) 157/89   Pulse 88   Temp 99.6 F (37.6 C)   Resp 19   SpO2 100%      Physical Exam Vitals and nursing note reviewed.  Constitutional:      General: She is not in acute distress.    Appearance: Normal appearance. She is not ill-appearing or toxic-appearing.  HENT:     Head: Normocephalic and atraumatic.     Right Ear: Tympanic membrane, ear canal and external ear normal.     Left Ear: Tympanic membrane, ear canal and external ear normal.     Nose: Congestion and rhinorrhea present.     Mouth/Throat:     Mouth: Mucous membranes are moist.     Pharynx: Oropharynx is clear. Posterior oropharyngeal erythema present.  Eyes:     General: No scleral icterus.       Right eye: No discharge.        Left eye: No discharge.     Conjunctiva/sclera: Conjunctivae normal.  Cardiovascular:     Rate and Rhythm: Normal rate and regular rhythm.     Heart sounds: Normal heart sounds.  Pulmonary:     Effort: Pulmonary effort is normal. No respiratory distress.     Breath sounds: Normal breath sounds. No wheezing, rhonchi or rales.  Abdominal:     Palpations: Abdomen is soft.     Tenderness: There is no abdominal tenderness.  There is no right CVA tenderness or left CVA tenderness.  Musculoskeletal:     Cervical back: Neck supple.  Skin:    General: Skin is dry.  Neurological:     General: No focal deficit present.     Mental Status: She is alert. Mental status is at baseline.  Motor: No weakness.     Gait: Gait normal.  Psychiatric:        Mood and Affect: Mood normal.        Behavior: Behavior normal.        Thought Content: Thought content normal.      UC Treatments / Results  Labs (all labs ordered are listed, but only abnormal results are displayed) Labs Reviewed  URINALYSIS, COMPLETE (UACMP) WITH MICROSCOPIC - Abnormal; Notable for the following components:      Result Value   APPearance CLOUDY (*)    Specific Gravity, Urine <1.005 (*)    Hgb urine dipstick MODERATE (*)    Leukocytes,Ua SMALL (*)    Bacteria, UA MANY (*)    All other components within normal limits  SARS CORONAVIRUS 2 (TAT 6-24 HRS)  URINE CULTURE    EKG   Radiology No results found.  Procedures Procedures (including critical care time)  Medications Ordered in UC Medications - No data to display  Initial Impression / Assessment and Plan / UC Course  I have reviewed the triage vital signs and the nursing notes.  Pertinent labs & imaging results that were available during my care of the patient were reviewed by me and considered in my medical decision making (see chart for details).   70 year old female presenting for cough, congestion, and fatigue x2 days.  Additionally presenting for malodorous urine and dysuria for a couple of days as well.  Vitals are stable.  Her blood pressure is elevated at 157/89 and temperature is low-grade at 99.6.  Oxygen is 100%.  She is overall well-appearing.  COVID test performed.  Current CDC guidelines, isolation protocol and ED precautions discussed the possibility that if she test positive for COVID-19 we could try an antiviral medication.  She states she might be open to  this.  At this time.  Supportive care advised of the likely leave she has a viral infection.  Sent benzonatate and Atrovent nasal spray.  Advised she can continue with the Coricidin if she would like.  Tylenol and ibuprofen for any headaches and body aches.  Urinalysis shows moderate blood, small leukocytes and many bacteria.  We will send urine for culture and treat for suspected UTI based on symptoms and urinalysis.  Treating at this time with cefdinir.  Advised patient that I do not believe she has a bacterial sinus infection but cefdinir would also treat this.  Advised to continue with her increased water intake and rest.  ED precautions for UTI reviewed.  Final Clinical Impressions(s) / UC Diagnoses   Final diagnoses:  Viral upper respiratory tract infection  Cough  Acute cystitis with hematuria     Discharge Instructions     URI/COLD SYMPTOMS: Your exam today is consistent with a viral illness. Antibiotics are not indicated at this time. Use medications as directed, including cough syrup, nasal saline, and decongestants. Your symptoms should improve over the next few days and resolve within 7-10 days. Increase rest and fluids. F/u if symptoms worsen or predominate such as sore throat, ear pain, productive cough, shortness of breath, or if you develop high fevers or worsening fatigue over the next several days.    UTI: Based on either symptoms or urinalysis, you may have a urinary tract infection. We will send the urine for culture and call with results in a few days. Begin antibiotics at this time. Your symptoms should be much improved over the next 2-3 days. Increase rest and fluid intake. If for  some reason symptoms are worsening or not improving after a couple of days or the urine culture determines the antibiotics you are taking will not treat the infection, the antibiotics may be changed. Return or go to ER for fever, back pain, worsening urinary pain, discharge, increased blood in urine.  May take Tylenol or Motrin OTC for pain relief or consider AZO if no contraindications   You have received COVID testing today either for positive exposure, concerning symptoms that could be related to COVID infection, screening purposes, or re-testing after confirmed positive.  Your test obtained today checks for active viral infection in the last 1-2 weeks. If your test is negative now, you can still test positive later. So, if you do develop symptoms you should either get re-tested and/or isolate x 5 days and then strict mask use x 5 days (unvaccinated) or mask use x 10 days (vaccinated). Please follow CDC guidelines.  While Rapid antigen tests come back in 15-20 minutes, send out PCR/molecular test results typically come back within 1-3 days. In the mean time, if you are symptomatic, assume this could be a positive test and treat/monitor yourself as if you do have COVID.   We will call with test results if positive. Please download the MyChart app and set up a profile to access test results.   If symptomatic, go home and rest. Push fluids. Take Tylenol as needed for discomfort. Gargle warm salt water. Throat lozenges. Take Mucinex DM or Robitussin for cough. Humidifier in bedroom to ease coughing. Warm showers. Also review the COVID handout for more information.  COVID-19 INFECTION: The incubation period of COVID-19 is approximately 14 days after exposure, with most symptoms developing in roughly 4-5 days. Symptoms may range in severity from mild to critically severe. Roughly 80% of those infected will have mild symptoms. People of any age may become infected with COVID-19 and have the ability to transmit the virus. The most common symptoms include: fever, fatigue, cough, body aches, headaches, sore throat, nasal congestion, shortness of breath, nausea, vomiting, diarrhea, changes in smell and/or taste.    COURSE OF ILLNESS Some patients may begin with mild disease which can progress quickly into  critical symptoms. If your symptoms are worsening please call ahead to the Emergency Department and proceed there for further treatment. Recovery time appears to be roughly 1-2 weeks for mild symptoms and 3-6 weeks for severe disease.   GO IMMEDIATELY TO ER FOR FEVER YOU ARE UNABLE TO GET DOWN WITH TYLENOL, BREATHING PROBLEMS, CHEST PAIN, FATIGUE, LETHARGY, INABILITY TO EAT OR DRINK, ETC  QUARANTINE AND ISOLATION: To help decrease the spread of COVID-19 please remain isolated if you have COVID infection or are highly suspected to have COVID infection. This means -stay home and isolate to one room in the home if you live with others. Do not share a bed or bathroom with others while ill, sanitize and wipe down all countertops and keep common areas clean and disinfected. Stay home for 5 days. If you have no symptoms or your symptoms are resolving after 5 days, you can leave your house. Continue to wear a mask around others for 5 additional days. If you have been in close contact (within 6 feet) of someone diagnosed with COVID 19, you are advised to quarantine in your home for 14 days as symptoms can develop anywhere from 2-14 days after exposure to the virus. If you develop symptoms, you  must isolate.  Most current guidelines for COVID after exposure -unvaccinated: isolate 5  days and strict mask use x 5 days. Test on day 5 is possible -vaccinated: wear mask x 10 days if symptoms do not develop -You do not necessarily need to be tested for COVID if you have + exposure and  develop symptoms. Just isolate at home x10 days from symptom onset During this global pandemic, CDC advises to practice social distancing, try to stay at least 92ft away from others at all times. Wear a face covering. Wash and sanitize your hands regularly and avoid going anywhere that is not necessary.  KEEP IN MIND THAT THE COVID TEST IS NOT 100% ACCURATE AND YOU SHOULD STILL DO EVERYTHING TO PREVENT POTENTIAL SPREAD OF VIRUS TO OTHERS  (WEAR MASK, WEAR GLOVES, [place] HANDS AND SANITIZE REGULARLY). IF INITIAL TEST IS NEGATIVE, THIS MAY NOT MEAN YOU ARE DEFINITELY NEGATIVE. MOST ACCURATE TESTING IS DONE 5-7 DAYS AFTER EXPOSURE.   It is not advised by CDC to get re-tested after receiving a positive COVID test since you can still test positive for weeks to months after you have already cleared the virus.   *If you have not been vaccinated for COVID, I strongly suggest you consider getting vaccinated as long as there are no contraindications.      ED Prescriptions    Medication Sig Dispense Auth. Provider   cefdinir (OMNICEF) 300 MG capsule Take 1 capsule (300 mg total) by mouth 2 (two) times daily for 7 days. 14 capsule Laurene Footman B, PA-C   benzonatate (TESSALON) 100 MG capsule Take 2 capsules (200 mg total) by mouth 3 (three) times daily as needed for cough. 21 capsule Laurene Footman B, PA-C   ipratropium (ATROVENT) 0.06 % nasal spray Place 2 sprays into both nostrils 4 (four) times daily. 15 mL Danton Clap, PA-C     PDMP not reviewed this encounter.   Danton Clap, PA-C 01/16/21 1738

## 2021-01-17 LAB — SARS CORONAVIRUS 2 (TAT 6-24 HRS): SARS Coronavirus 2: NEGATIVE

## 2021-01-19 LAB — URINE CULTURE: Culture: >=100000 — AB

## 2021-05-13 ENCOUNTER — Ambulatory Visit
Admission: RE | Admit: 2021-05-13 | Discharge: 2021-05-13 | Disposition: A | Discharge status: Home / Self Care | Payer: Medicare PPO | Source: Ambulatory Visit | Attending: Physician Assistant | Admitting: Physician Assistant

## 2021-05-13 ENCOUNTER — Ambulatory Visit (INDEPENDENT_AMBULATORY_CARE_PROVIDER_SITE_OTHER): Payer: Medicare PPO

## 2021-05-13 ENCOUNTER — Other Ambulatory Visit: Payer: Self-pay

## 2021-05-13 VITALS — BP 141/91 | HR 79 | Temp 98.5°F | Resp 18

## 2021-05-13 DIAGNOSIS — S7002XA Contusion of left hip, initial encounter: Secondary | ICD-10-CM

## 2021-05-13 DIAGNOSIS — R102 Pelvic and perineal pain: Secondary | ICD-10-CM

## 2021-05-13 DIAGNOSIS — W19XXXA Unspecified fall, initial encounter: Secondary | ICD-10-CM

## 2021-05-13 DIAGNOSIS — M545 Low back pain, unspecified: Secondary | ICD-10-CM

## 2021-05-13 MED ORDER — NAPROXEN 500 MG PO TABS: 500.0000 mg | ORAL_TABLET | Freq: Two times a day (BID) | ORAL | 0 refills | Status: DC

## 2021-05-13 NOTE — ED Triage Notes (Signed)
Pt presents today with c/o of lower back pain s/p fall in kitchen this afternoon. She took Advil 200 mg pta. She also applied ice.

## 2021-05-13 NOTE — ED Provider Notes (Signed)
[facility]    CSN: [id number] Arrival date & time: 05/13/21  1640      History   Chief Complaint Chief Complaint  Patient presents with   Fall   Back Pain    HPI Abigail Nguyen is a 70 y.o. female.   Patient is a 70 year old female who states she was standing in her kitchen getting something of the refrigerator stepped onto something sticky.  She states she turned to go towards the sink to get something to clear out with when she lost her balance and fell, landing mostly on the right side of her buttocks and right elbow.  Patient denies any pain to her elbow.  States her pain is on the left side only.  She has no pain on the right.   Past Medical History:  Diagnosis Date   Breast cyst    Hypertension    Hypothyroidism    Thyroid disease     There are no problems to display for this patient.   Past Surgical History:  Procedure Laterality Date   ANKLE SURGERY     APPENDECTOMY     BREAST CYST ASPIRATION Right 1980's   neg   CESAREAN SECTION     CHOLECYSTECTOMY     COLONOSCOPY WITH PROPOFOL N/A 02/23/2017   Procedure: COLONOSCOPY WITH PROPOFOL;  Surgeon: Manya Silvas, MD;  Location: [facility] ENDOSCOPY;  Service: Endoscopy;  Laterality: N/A;   EYE SURGERY     laparascopy N/A 1981   diagnostic   TONSILLECTOMY      OB History   No obstetric history on file.      Home Medications    Prior to Admission medications   Medication Sig Start Date End Date Taking? Authorizing Provider  naproxen (NAPROSYN) 500 MG tablet Take 1 tablet (500 mg total) by mouth 2 (two) times daily. 05/13/21  Yes Luvenia Redden, PA-C  benzonatate (TESSALON) 100 MG capsule Take 2 capsules (200 mg total) by mouth 3 (three) times daily as needed for cough. 01/16/21   Laurene Footman B, PA-C  calcium carbonate (TUMS - DOSED IN MG ELEMENTAL CALCIUM) 500 MG chewable tablet Chew 1 tablet by mouth daily.    [provider]  carvedilol (COREG) 3.125 MG tablet Take by mouth.  12/07/18 01/11/20  [provider]  ipratropium (ATROVENT) 0.06 % nasal spray Place 2 sprays into both nostrils 4 (four) times daily. 01/16/21   Danton Clap, PA-C  levothyroxine (SYNTHROID, LEVOTHROID) 25 MCG tablet Take 25 mcg by mouth daily before breakfast.    [provider]  lisinopril (PRINIVIL,ZESTRIL) 10 MG tablet Take 10 mg by mouth daily.    [provider]  mupirocin ointment (BACTROBAN) 2 % Apply two times a day for 7 days. 01/11/20   Marylene Land, NP    Family History Family History  Problem Relation Age of Onset   Colon cancer Mother    Coronary artery disease Father    Stroke Father    Breast cancer Maternal Grandmother 77    Social History Social History   Tobacco Use   Smoking status: Never   Smokeless tobacco: Never  Vaping Use   Vaping Use: Never used  Substance Use Topics   Alcohol use: Yes    Comment: rarely   Drug use: No     Allergies   Codeine, Compazine [prochlorperazine edisylate], and Morphine and related   Review of Systems Review of Systems as noted above in HPI.  Other systems reviewed and found  to be negative   Physical Exam Triage Vital Signs ED Triage Vitals  Enc Vitals Group     BP 05/13/21 1717 (!) 141/91     Pulse Rate 05/13/21 1717 79     Resp 05/13/21 1717 18     Temp 05/13/21 1717 98.5 F (36.9 C)     Temp Source 05/13/21 1717 Oral     SpO2 05/13/21 1717 98 %     Weight --      Height --      Head Circumference --      Peak Flow --      Pain Score 05/13/21 1715 8     Pain Loc --      Pain Edu? --      Excl. in [place]? --    No data found.  Updated Vital Signs BP (!) 141/91 (BP Location: Left Arm)   Pulse 79   Temp 98.5 F (36.9 C) (Oral)   Resp 18   SpO2 98%    Physical Exam Constitutional:      General: She is not in acute distress.    Appearance: Normal appearance. She is not ill-appearing.  Musculoskeletal:     Thoracic back: Normal.     Lumbar back: Tenderness present. No  swelling or deformity.       Back:  Neurological:     Mental Status: She is alert.     UC Treatments / Results  Labs (all labs ordered are listed, but only abnormal results are displayed) Labs Reviewed - No data to display  EKG   Radiology DG Lumbar Spine Complete  Result Date: 05/13/2021 CLINICAL DATA:  Low back and pelvic pain after falling from standing position. EXAM: LUMBAR SPINE - COMPLETE 4+ VIEW COMPARISON:  Abdominal CT 12/12/2018. FINDINGS: The bones appear demineralized. There are 5 lumbar type vertebral bodies. The alignment is stable with a degenerative grade 1 anterolisthesis at L4-5 and L5-S1. There is no evidence of acute fracture or pars defect. Multilevel facet degenerative changes are stable. IMPRESSION: No evidence of acute lumbar spine fracture. Facet arthropathy inferiorly with stable grade 1 anterolisthesis at L4-5 and L5-S1. Electronically Signed   By: Richardean Sale M.D.   On: 05/13/2021 18:39   DG Pelvis 1-2 Views  Result Date: 05/13/2021 CLINICAL DATA:  Fall, left lower back and pelvic tenderness EXAM: PELVIS - 1-2 VIEW COMPARISON:  None. FINDINGS: There is no evidence of displaced pelvic fracture or diastasis. No pelvic bone lesions are seen. IMPRESSION: No displaced hip or pelvic fracture in single frontal view. Please note that plain radiographs are significantly insensitive for hip and pelvic fracture. Recommend CT or MRI to more sensitively evaluate for fracture if there is high clinical concern. Electronically Signed   By: Eddie Candle M.D.   On: 05/13/2021 18:38    Procedures Procedures (including critical care time)  Medications Ordered in UC Medications - No data to display  Initial Impression / Assessment and Plan / UC Course  I have reviewed the triage vital signs and the nursing notes.  Pertinent labs & imaging results that were available during my care of the patient were reviewed by me and considered in my medical decision making (see chart  for details).    Patient with pain to left lower back/pelvic area after falling from standing.  No pain to the right side or her right elbow.  We will check an x-ray.  X-ray negative.  Recommend follow-up CT or MRI if her pain continues.  Final Clinical Impressions(s) / UC Diagnoses   Final diagnoses:  Fall, initial encounter  Acute left-sided low back pain, unspecified whether sciatica present  Contusion of left hip, initial encounter     Discharge Instructions      -Naproxen: 1 tablet twice a day as needed for pain -Alternate cold/heat compresses -Test information for low back stretching exercises -If pain continues and does not improve for the next 5 to 7 days, would follow-up with primary care for further imaging to rule out a fracture.     ED Prescriptions     Medication Sig Dispense Auth. Provider   naproxen (NAPROSYN) 500 MG tablet Take 1 tablet (500 mg total) by mouth 2 (two) times daily. 30 tablet Luvenia Redden, PA-C      PDMP not reviewed this encounter.   Luvenia Redden, PA-C 05/13/21 1843

## 2021-05-13 NOTE — Discharge Instructions (Addendum)
-  Naproxen: 1 tablet twice a day as needed for pain -Alternate cold/heat compresses -Test information for low back stretching exercises -If pain continues and does not improve for the next 5 to 7 days, would follow-up with primary care for further imaging to rule out a fracture.

## 2021-07-05 ENCOUNTER — Other Ambulatory Visit: Payer: Self-pay | Admitting: Internal Medicine

## 2021-07-05 DIAGNOSIS — Z1231 Encounter for screening mammogram for malignant neoplasm of breast: Secondary | ICD-10-CM

## 2021-07-30 ENCOUNTER — Ambulatory Visit
Admission: RE | Admit: 2021-07-30 | Discharge: 2021-07-30 | Disposition: A | Discharge status: Home / Self Care | Payer: Medicare PPO | Source: Ambulatory Visit | Attending: Internal Medicine | Admitting: Internal Medicine

## 2021-07-30 ENCOUNTER — Other Ambulatory Visit: Payer: Self-pay

## 2021-07-30 DIAGNOSIS — Z1231 Encounter for screening mammogram for malignant neoplasm of breast: Secondary | ICD-10-CM | POA: Diagnosis not present

## 2021-09-15 IMAGING — CT CT ANGIO CHEST
1 of 2 series · 19 of 32 positions shown · IV contrast (omnipaque)
Comparison: 04/20/2017

CLINICAL DATA: Dizziness and near syncope

EXAM:
CT ANGIOGRAPHY CHEST WITH CONTRAST
TECHNIQUE: Multidetector CT imaging of the chest was performed using the
standard protocol during bolus administration of intravenous
contrast. Multiplanar CT image reconstructions and MIPs were
obtained to evaluate the vascular anatomy.
CONTRAST:  100mL OMNIPAQUE IOHEXOL 350 MG/ML SOLN

[Series 10: thins · axial · 0.62mm/px · z∈[-564,-321]mm · 19 of 271 slices shown]
[im 14/271  lung]
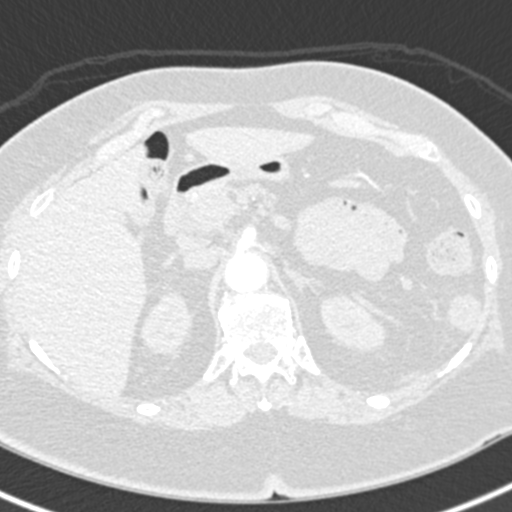
[im 28/271  mediastinal]
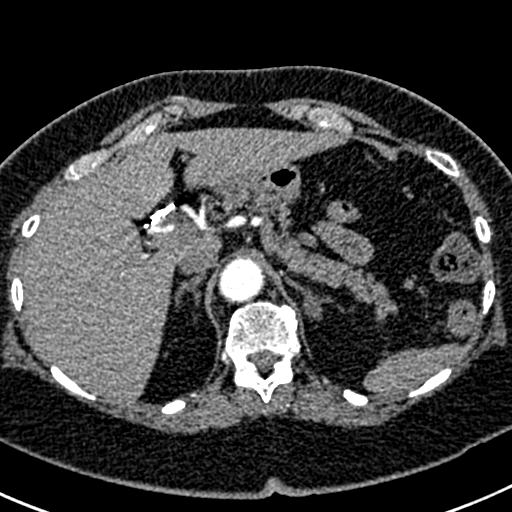
[im 41/271  lung]
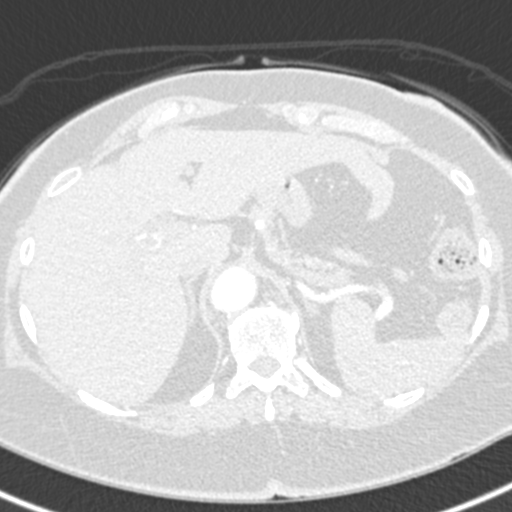
[im 68/271  mediastinal]
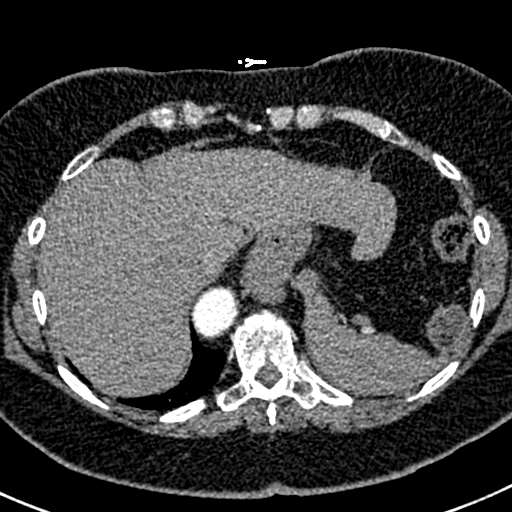
[im 82/271  lung]
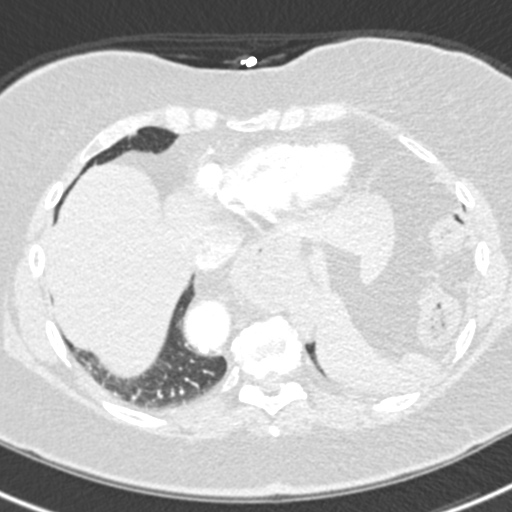
[im 91/271  mediastinal]
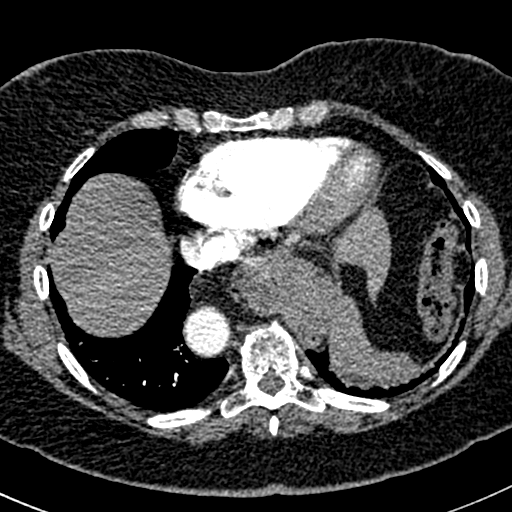
[im 95/271  lung]
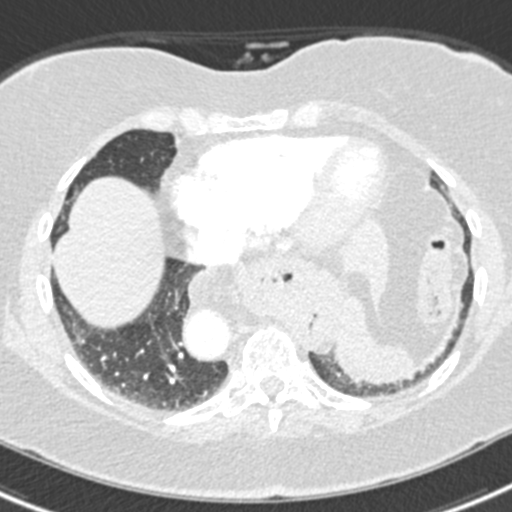
[im 109/271  mediastinal]
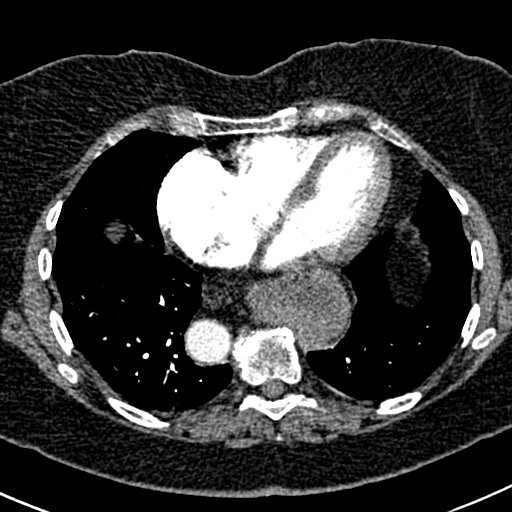
[im 122/271  lung]
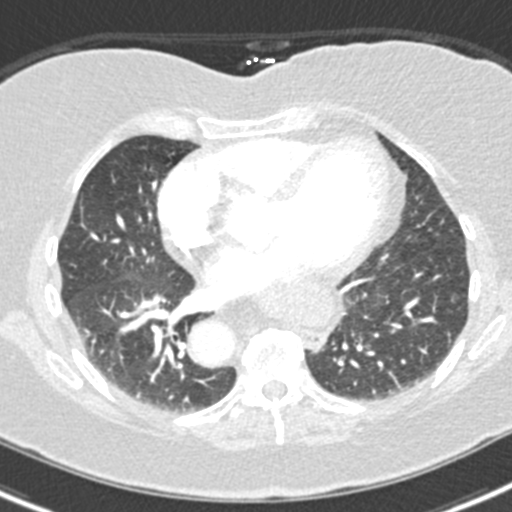
[im 136/271  mediastinal]
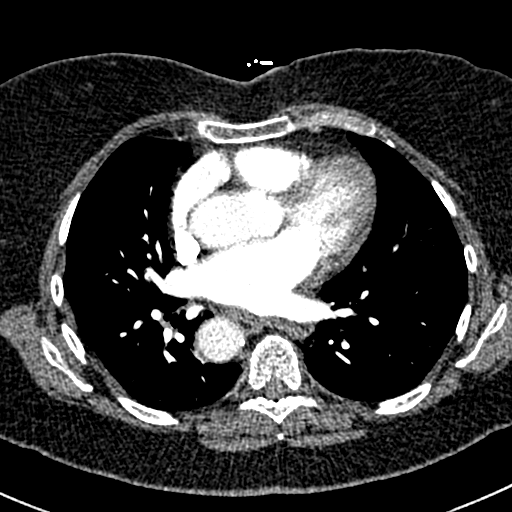
[im 149/271  lung]
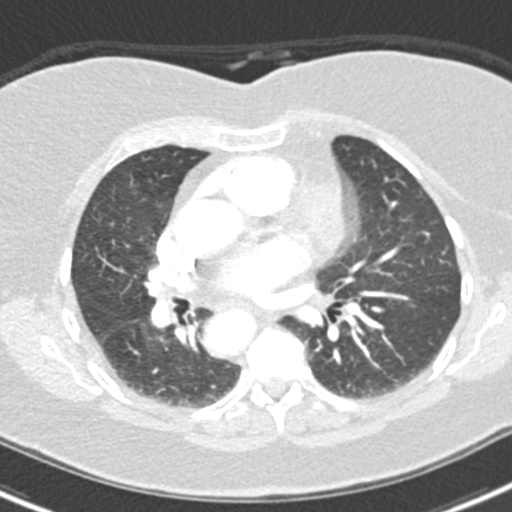
[im 163/271  mediastinal]
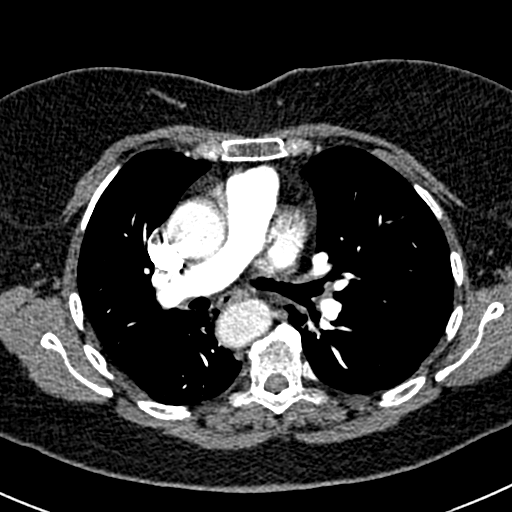
[im 176/271  lung]
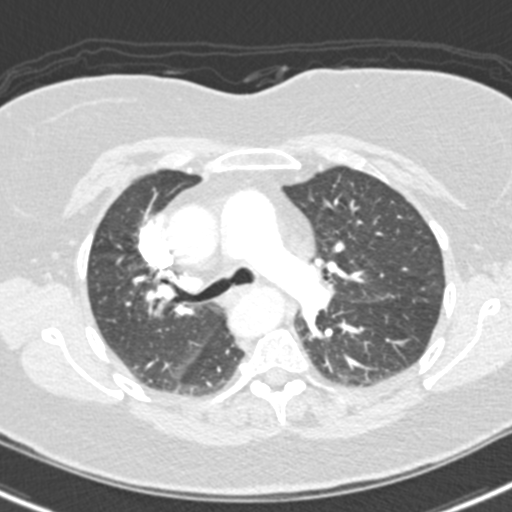
[im 181/271  mediastinal]
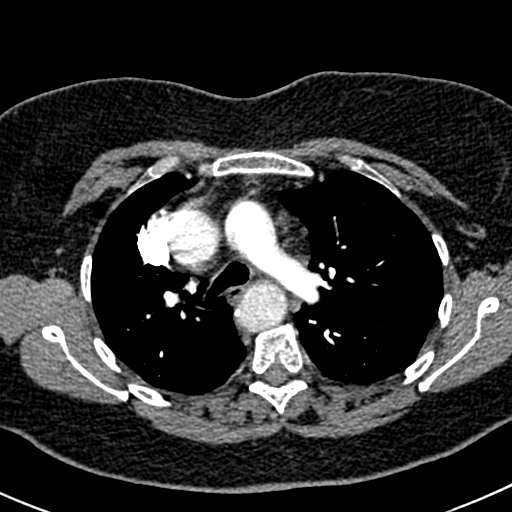
[im 190/271  lung]
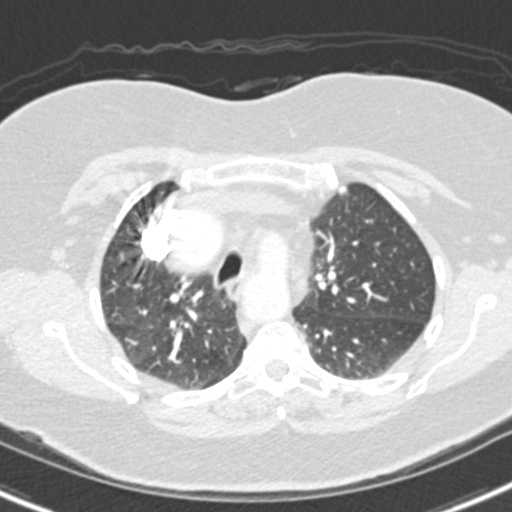
[im 203/271  mediastinal]
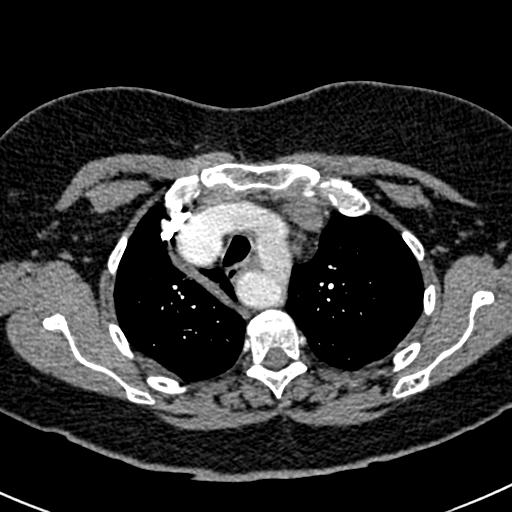
[im 230/271  lung]
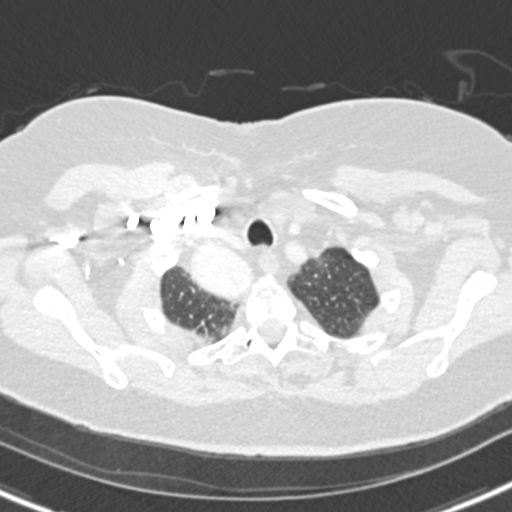
[im 244/271  mediastinal]
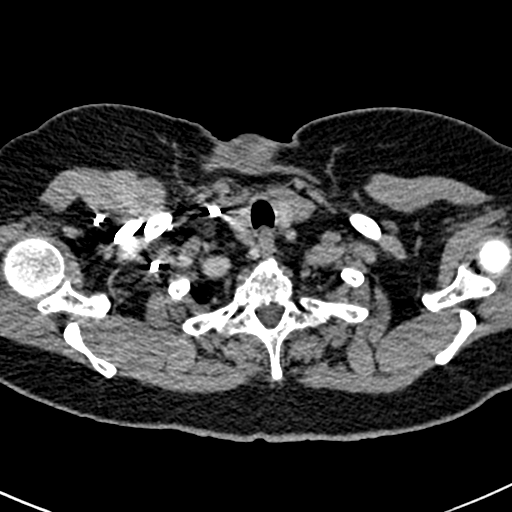
[im 257/271  lung]
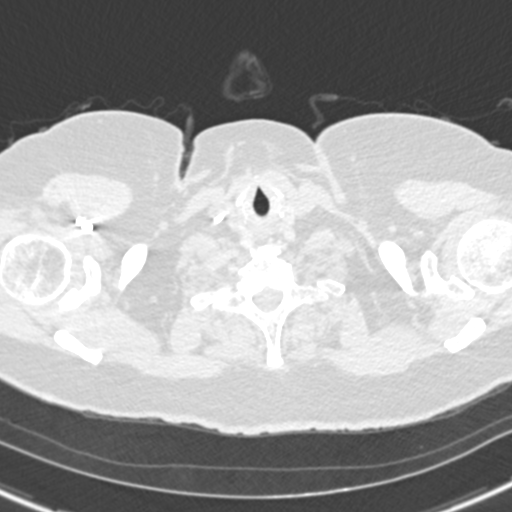

[19 of 32 positions shown; findings below may reference images not displayed]

FINDINGS: Cardiovascular: Normal heart size. No pericardial effusion.
Duplicated aortic arch. No acute vascular finding, including
pulmonary embolism.

Mediastinum/Nodes: Negative for adenopathy. Moderate sliding hiatal
hernia.

Lungs/Pleura: There is no edema, consolidation, effusion, or
pneumothorax.

Upper Abdomen: Cholecystectomy. Stable left adrenal adenoma
measuring 10 mm

Musculoskeletal: Generalized thoracic disc narrowing and endplate
spurring. No acute or aggressive finding

Review of the MIP images confirms the above findings.
IMPRESSION: 1. Negative for pulmonary embolism or other acute finding.
2. Duplicated aortic arch.
3. Moderate hiatal hernia.

## 2022-03-24 ENCOUNTER — Ambulatory Visit
Admission: RE | Admit: 2022-03-24 | Discharge: 2022-03-24 | Disposition: A | Discharge status: Home / Self Care | Payer: Medicare PPO | Source: Ambulatory Visit | Attending: Emergency Medicine | Admitting: Emergency Medicine

## 2022-03-24 VITALS — BP 161/89 | HR 71 | Temp 98.2°F | Ht 64.0 in | Wt 182.0 lb

## 2022-03-24 DIAGNOSIS — J0140 Acute pansinusitis, unspecified: Secondary | ICD-10-CM | POA: Diagnosis not present

## 2022-03-24 MED ORDER — AMOXICILLIN-POT CLAVULANATE 875-125 MG PO TABS: 1.0000 | ORAL_TABLET | Freq: Two times a day (BID) | ORAL | 0 refills | Status: AC

## 2022-03-24 MED ORDER — IPRATROPIUM BROMIDE 0.06 % NA SOLN: 2.0000 | Freq: Four times a day (QID) | NASAL | 12 refills | Status: DC

## 2022-03-24 MED ORDER — BENZONATATE 100 MG PO CAPS: 200.0000 mg | ORAL_CAPSULE | Freq: Three times a day (TID) | ORAL | 0 refills | Status: DC

## 2022-03-24 NOTE — ED Triage Notes (Signed)
Patient presents to [facility] for chest congestion and cough. -- started 2 weeks ago.   Patient has been taking over the counter nasal sprays and sinus medication with no relief

## 2022-03-24 NOTE — Discharge Instructions (Signed)
The Augmentin twice daily with food for 10 days for treatment of your sinusitis.  Perform sinus irrigation 2-3 times a day with a NeilMed sinus rinse kit and distilled water.  Do not use tap water.  You can use plain over-the-counter Mucinex every 6 hours to break up the stickiness of the mucus so your body can clear it.  Increase your oral fluid intake to thin out your mucus so that is also able for your body to clear more easily.  Take an over-the-counter probiotic, such as Culturelle-align-activia, 1 hour after each dose of antibiotic to prevent diarrhea.  Use the Atrovent nasal spray, 2 squirts in each nostril every 6 hours, as needed for runny nose and postnasal drip.  Use the Tessalon Perles every 8 hours during the day.  Take them with a small sip of water.  They may give you some numbness to the base of your tongue or a metallic taste in your mouth, this is normal.  Use OTC Coricidin HBP as needed for cough.  Return for reevaluation or see your primary care provider for any new or worsening symptoms.   If you develop any new or worsening symptoms return for reevaluation or see your primary care provider.

## 2022-03-24 NOTE — ED Provider Notes (Signed)
[facility]    CSN: [id number] Arrival date & time: 03/24/22  1249      History   Chief Complaint Chief Complaint  Patient presents with   Cough    HPI Abigail Nguyen is a 71 y.o. female.   HPI  71 year old female here for evaluation of respiratory complaints.  Patient reports that for the last 2 weeks she has been experiencing sinus pressure, postnasal drip, nasal discharge, ear pain, scratchy throat, cough that is productive in the morning and at night, and intermittent wheezing.  She denies any fever or shortness of breath.  He has been using over-the-counter Coricidin HBP for her cough.  Past Medical History:  Diagnosis Date   Breast cyst    Hypertension    Hypothyroidism    Thyroid disease     There are no problems to display for this patient.   Past Surgical History:  Procedure Laterality Date   ANKLE SURGERY     APPENDECTOMY     BREAST CYST ASPIRATION Right 1980's   neg   CESAREAN SECTION     CHOLECYSTECTOMY     COLONOSCOPY WITH PROPOFOL N/A 02/23/2017   Procedure: COLONOSCOPY WITH PROPOFOL;  Surgeon: Manya Silvas, MD;  Location: [facility] ENDOSCOPY;  Service: Endoscopy;  Laterality: N/A;   EYE SURGERY     laparascopy N/A 1981   diagnostic   TONSILLECTOMY      OB History   No obstetric history on file.      Home Medications    Prior to Admission medications   Medication Sig Start Date End Date Taking? Authorizing Provider  amoxicillin-clavulanate (AUGMENTIN) 875-125 MG tablet Take 1 tablet by mouth every 12 (twelve) hours for 10 days. 03/24/22 04/03/22 Yes Margarette Canada, NP  benzonatate (TESSALON) 100 MG capsule Take 2 capsules (200 mg total) by mouth every 8 (eight) hours. 03/24/22  Yes Margarette Canada, NP  calcium carbonate (TUMS - DOSED IN MG ELEMENTAL CALCIUM) 500 MG chewable tablet Chew 1 tablet by mouth daily.   Yes [provider]  carvedilol (COREG) 3.125 MG tablet Take by mouth. 12/07/18  Yes [provider]   ipratropium (ATROVENT) 0.06 % nasal spray Place 2 sprays into both nostrils 4 (four) times daily. 03/24/22  Yes Margarette Canada, NP  levothyroxine (SYNTHROID, LEVOTHROID) 25 MCG tablet Take 25 mcg by mouth daily before breakfast.   Yes [provider]  lisinopril (PRINIVIL,ZESTRIL) 10 MG tablet Take 10 mg by mouth daily.   Yes [provider]    Family History Family History  Problem Relation Age of Onset   Colon cancer Mother    Coronary artery disease Father    Stroke Father    Breast cancer Maternal Grandmother 23    Social History Social History   Tobacco Use   Smoking status: Never   Smokeless tobacco: Never  Vaping Use   Vaping Use: Never used  Substance Use Topics   Alcohol use: Yes    Comment: rarely   Drug use: No     Allergies   Codeine, Compazine [prochlorperazine edisylate], and Morphine and related   Review of Systems Review of Systems  Constitutional:  Negative for fever.  HENT:  Positive for congestion, ear pain, postnasal drip, rhinorrhea, sinus pressure and sore throat.   Respiratory:  Positive for cough and wheezing. Negative for shortness of breath.      Physical Exam Triage Vital Signs ED Triage Vitals  Enc Vitals Group     BP 03/24/22 1406 Marland Kitchen)  161/89     Pulse Rate 03/24/22 1406 71     Resp --      Temp 03/24/22 1406 98.2 F (36.8 C)     Temp Source 03/24/22 1406 Oral     SpO2 03/24/22 1406 99 %     Weight 03/24/22 1403 182 lb (82.6 kg)     Height 03/24/22 1403 $RemoveBefor'5\' 4"'iFdiOwBYlHgr$  (1.626 m)     Head Circumference --      Peak Flow --      Pain Score 03/24/22 1402 0     Pain Loc --      Pain Edu? --      Excl. in [place]? --    No data found.  Updated Vital Signs BP (!) 161/89 (BP Location: Left Arm)   Pulse 71   Temp 98.2 F (36.8 C) (Oral)   Ht $R'5\' 4"'Tx$  (1.626 m)   Wt 182 lb (82.6 kg)   SpO2 99%   BMI 31.24 kg/m   Visual Acuity Right Eye Distance:   Left Eye Distance:   Bilateral Distance:    Right Eye Near:   Left Eye  Near:    Bilateral Near:     Physical Exam Vitals and nursing note reviewed.  Constitutional:      Appearance: Normal appearance. She is not ill-appearing.  HENT:     Head: Normocephalic and atraumatic.     Right Ear: Tympanic membrane, ear canal and external ear normal. There is no impacted cerumen.     Left Ear: Tympanic membrane, ear canal and external ear normal. There is no impacted cerumen.     Nose: Congestion and rhinorrhea present.     Mouth/Throat:     Mouth: Mucous membranes are moist.     Pharynx: Oropharynx is clear. Posterior oropharyngeal erythema present. No oropharyngeal exudate.  Cardiovascular:     Rate and Rhythm: Normal rate and regular rhythm.     Pulses: Normal pulses.     Heart sounds: Normal heart sounds. No murmur heard.    No friction rub. No gallop.  Pulmonary:     Effort: Pulmonary effort is normal.     Breath sounds: Normal breath sounds. No wheezing, rhonchi or rales.  Musculoskeletal:     Cervical back: Normal range of motion and neck supple.  Lymphadenopathy:     Cervical: No cervical adenopathy.  Skin:    General: Skin is warm and dry.     Capillary Refill: Capillary refill takes less than 2 seconds.     Findings: No erythema or rash.  Neurological:     General: No focal deficit present.     Mental Status: She is alert and oriented to person, place, and time.  Psychiatric:        Mood and Affect: Mood normal.        Behavior: Behavior normal.        Thought Content: Thought content normal.        Judgment: Judgment normal.      UC Treatments / Results  Labs (all labs ordered are listed, but only abnormal results are displayed) Labs Reviewed - No data to display  EKG   Radiology No results found.  Procedures Procedures (including critical care time)  Medications Ordered in UC Medications - No data to display  Initial Impression / Assessment and Plan / UC Course  I have reviewed the triage vital signs and the nursing  notes.  Pertinent labs & imaging results that were available during my care  of the patient were reviewed by me and considered in my medical decision making (see chart for details).  Patient is a pleasant, nontoxic-appearing 71 year old female here for evaluation of respiratory complaints as outlined in HPI above.  Physical exam reveals pearly-gray tympanic membranes bilaterally with normal light reflex and clear external auditory canals.  Nasal mucosa is erythematous and edematous with thick yellow discharge in both nares.  Oropharyngeal exam reveals mild posterior oropharyngeal erythema with yellow postnasal drip.  No cervical lymphadenopathy appreciable exam.  Cardiopulmonary exam reveals S1-S2 heart sounds with regular rate and rhythm and lung sounds that are clear to auscultation all fields.  Patient does have tenderness to percussion of frontal and maxillary sinuses bilaterally.  Patient's exam is consistent with pansinusitis.  I will treat her with Augmentin twice daily for 10 days.  I will also prescribe Atrovent nasal spray to help with nasal congestion and Tessalon Perles help with cough.  She can continue to use Coricidin HBP as needed for cough at nighttime.  I have also discussed sinus irrigation with her and given her instruction on how to perform that at home to help alleviate her mucus burden.  Return precautions reviewed   Final Clinical Impressions(s) / UC Diagnoses   Final diagnoses:  Acute non-recurrent pansinusitis     Discharge Instructions      The Augmentin twice daily with food for 10 days for treatment of your sinusitis.  Perform sinus irrigation 2-3 times a day with a NeilMed sinus rinse kit and distilled water.  Do not use tap water.  You can use plain over-the-counter Mucinex every 6 hours to break up the stickiness of the mucus so your body can clear it.  Increase your oral fluid intake to thin out your mucus so that is also able for your body to clear more  easily.  Take an over-the-counter probiotic, such as Culturelle-align-activia, 1 hour after each dose of antibiotic to prevent diarrhea.  Use the Atrovent nasal spray, 2 squirts in each nostril every 6 hours, as needed for runny nose and postnasal drip.  Use the Tessalon Perles every 8 hours during the day.  Take them with a small sip of water.  They may give you some numbness to the base of your tongue or a metallic taste in your mouth, this is normal.  Use OTC Coricidin HBP as needed for cough.  Return for reevaluation or see your primary care provider for any new or worsening symptoms.   If you develop any new or worsening symptoms return for reevaluation or see your primary care provider.      ED Prescriptions     Medication Sig Dispense Auth. Provider   amoxicillin-clavulanate (AUGMENTIN) 875-125 MG tablet Take 1 tablet by mouth every 12 (twelve) hours for 10 days. 20 tablet Margarette Canada, NP   benzonatate (TESSALON) 100 MG capsule Take 2 capsules (200 mg total) by mouth every 8 (eight) hours. 21 capsule Margarette Canada, NP   ipratropium (ATROVENT) 0.06 % nasal spray Place 2 sprays into both nostrils 4 (four) times daily. 15 mL Margarette Canada, NP      PDMP not reviewed this encounter.   Margarette Canada, NP 03/24/22 1441

## 2022-07-29 ENCOUNTER — Other Ambulatory Visit: Payer: Self-pay | Admitting: Internal Medicine

## 2022-07-29 DIAGNOSIS — Z1231 Encounter for screening mammogram for malignant neoplasm of breast: Secondary | ICD-10-CM

## 2022-09-02 ENCOUNTER — Ambulatory Visit
Admission: RE | Admit: 2022-09-02 | Discharge: 2022-09-02 | Disposition: A | Discharge status: Home / Self Care | Payer: Medicare PPO | Source: Ambulatory Visit | Attending: Internal Medicine | Admitting: Internal Medicine

## 2022-09-02 DIAGNOSIS — Z1231 Encounter for screening mammogram for malignant neoplasm of breast: Secondary | ICD-10-CM | POA: Diagnosis present

## 2022-10-22 NOTE — H&P (Signed)
Pre-Procedure H&P   Patient ID: Abigail Nguyen is a 72 y.o. female.  Gastroenterology Provider: Annamaria Helling, DO  Referring Provider: Dawson Bills, NP PCP: Kirk Ruths, MD  Date: 10/23/2022  HPI Ms. Abigail Nguyen is a 72 y.o. female who presents today for Colonoscopy for surveillance- phx colon polyps; fhx crc .  Patient with a family history of colon cancer in her mother age 40.  Mother also had brain and breast cancer as well.  2 brothers with colon polyps.  Patient last underwent colonoscopy in June 2018 demonstrating diverticulosis and 1 tubular adenoma in the hepatic flexure as well as internal hemorrhoids.  2011 colonoscopy negative  Status post appendectomy cholecystectomy and C-section  Bowel movements are daily and formed without melena or hematochezia  Creatinine 0.8 A1c 6.2   Past Medical History:  Diagnosis Date   Breast cyst    Hypertension    Hypothyroidism    Thyroid disease     Past Surgical History:  Procedure Laterality Date   ANKLE SURGERY     APPENDECTOMY     BREAST CYST ASPIRATION Right 1980's   neg   CESAREAN SECTION     CHOLECYSTECTOMY     COLONOSCOPY WITH PROPOFOL N/A 02/23/2017   Procedure: COLONOSCOPY WITH PROPOFOL;  Surgeon: Manya Silvas, MD;  Location: [facility] ENDOSCOPY;  Service: Endoscopy;  Laterality: N/A;   EYE SURGERY     laparascopy N/A 1981   diagnostic   TONSILLECTOMY      Family History Mother-colon cancer age 17, brothers x 2-colon polyps No other h/o GI disease or malignancy  Review of Systems  Constitutional:  Negative for activity change, appetite change, chills, diaphoresis, fatigue, fever and unexpected weight change.  HENT:  Negative for trouble swallowing and voice change.   Respiratory:  Negative for shortness of breath and wheezing.   Cardiovascular:  Negative for chest pain, palpitations and leg swelling.  Gastrointestinal:  Negative for abdominal distention, abdominal pain, anal bleeding,  blood in stool, constipation, diarrhea, nausea, rectal pain and vomiting.  Musculoskeletal:  Negative for arthralgias and myalgias.  Skin:  Negative for color change and pallor.  Neurological:  Negative for dizziness, syncope and weakness.  Psychiatric/Behavioral:  Negative for confusion.   All other systems reviewed and are negative.    Medications No current facility-administered medications on file prior to encounter.   Current Outpatient Medications on File Prior to Encounter  Medication Sig Dispense Refill   carvedilol (COREG) 3.125 MG tablet Take by mouth.     levothyroxine (SYNTHROID, LEVOTHROID) 25 MCG tablet Take 25 mcg by mouth daily before breakfast.     lisinopril (PRINIVIL,ZESTRIL) 10 MG tablet Take 10 mg by mouth daily.     benzonatate (TESSALON) 100 MG capsule Take 2 capsules (200 mg total) by mouth every 8 (eight) hours. 21 capsule 0   calcium carbonate (TUMS - DOSED IN MG ELEMENTAL CALCIUM) 500 MG chewable tablet Chew 1 tablet by mouth daily.     ipratropium (ATROVENT) 0.06 % nasal spray Place 2 sprays into both nostrils 4 (four) times daily. (Patient not taking: Reported on 10/23/2022) 15 mL 12    Pertinent medications related to GI and procedure were reviewed by me with the patient prior to the procedure   Current Facility-Administered Medications:    0.9 %  sodium chloride infusion, , Intravenous, Continuous, Annamaria Helling, DO, Last Rate: 20 mL/hr at 10/23/22 0727, 1,000 mL at 10/23/22 7017      Allergies  Allergen  Reactions   Codeine Other (See Comments)    Pt said that she passed out from taking Codeine    Compazine [Prochlorperazine Edisylate] Nausea And Vomiting   Morphine And Related Nausea And Vomiting   Allergies were reviewed by me prior to the procedure  Objective   Body mass index is 30.96 kg/m. Vitals:   10/23/22 0708  BP: 117/75  Pulse: 74  Resp: 16  Temp: (!) 96.3 F (35.7 C)  SpO2: 98%  Weight: 81.8 kg  Height: '5\' 4"'$  (1.626 m)      Physical Exam Vitals and nursing note reviewed.  Constitutional:      General: She is not in acute distress.    Appearance: Normal appearance. She is obese. She is not ill-appearing, toxic-appearing or diaphoretic.  HENT:     Head: Normocephalic and atraumatic.     Nose: Nose normal.     Mouth/Throat:     Mouth: Mucous membranes are moist.     Pharynx: Oropharynx is clear.  Eyes:     General: No scleral icterus.    Extraocular Movements: Extraocular movements intact.  Cardiovascular:     Rate and Rhythm: Normal rate and regular rhythm.     Heart sounds: Normal heart sounds. No murmur heard.    No friction rub. No gallop.  Pulmonary:     Effort: Pulmonary effort is normal. No respiratory distress.     Breath sounds: Normal breath sounds. No wheezing, rhonchi or rales.  Abdominal:     General: Bowel sounds are normal. There is no distension.     Palpations: Abdomen is soft.     Tenderness: There is no abdominal tenderness. There is no guarding or rebound.  Musculoskeletal:     Cervical back: Neck supple.     Right lower leg: No edema.     Left lower leg: No edema.  Skin:    General: Skin is warm and dry.     Coloration: Skin is not jaundiced or pale.  Neurological:     General: No focal deficit present.     Mental Status: She is alert and oriented to person, place, and time. Mental status is at baseline.  Psychiatric:        Mood and Affect: Mood normal.        Behavior: Behavior normal.        Thought Content: Thought content normal.        Judgment: Judgment normal.      Assessment:  Ms. Abigail Nguyen is a 72 y.o. female  who presents today for Colonoscopy for surveillance- phx colon polyps; fhx crc .  Plan:  Colonoscopy with possible intervention today  Colonoscopy with possible biopsy, control of bleeding, polypectomy, and interventions as necessary has been discussed with the patient/patient representative. Informed consent was obtained from the  patient/patient representative after explaining the indication, nature, and risks of the procedure including but not limited to death, bleeding, perforation, missed neoplasm/lesions, cardiorespiratory compromise, and reaction to medications. Opportunity for questions was given and appropriate answers were provided. Patient/patient representative has verbalized understanding is amenable to undergoing the procedure.   Annamaria Helling, DO  [facility] Gastroenterology  Portions of the record may have been created with voice recognition software. Occasional wrong-word or 'sound-a-like' substitutions may have occurred due to the inherent limitations of voice recognition software.  Read the chart carefully and recognize, using context, where substitutions may have occurred.

## 2022-10-23 ENCOUNTER — Encounter: Payer: Self-pay | Admitting: Gastroenterology

## 2022-10-23 ENCOUNTER — Encounter
Admission: RE | Disposition: A | Discharge status: Home / Self Care | Payer: Self-pay | Source: Home / Self Care | Attending: Gastroenterology

## 2022-10-23 ENCOUNTER — Ambulatory Visit
Admission: RE | Admit: 2022-10-23 | Discharge: 2022-10-23 | Disposition: A | Discharge status: Home / Self Care | Payer: Medicare PPO | Attending: Gastroenterology | Admitting: Gastroenterology

## 2022-10-23 ENCOUNTER — Ambulatory Visit: Payer: Medicare PPO | Admitting: Anesthesiology

## 2022-10-23 DIAGNOSIS — Z83719 Family history of colon polyps, unspecified: Secondary | ICD-10-CM | POA: Insufficient documentation

## 2022-10-23 DIAGNOSIS — I10 Essential (primary) hypertension: Secondary | ICD-10-CM | POA: Diagnosis not present

## 2022-10-23 DIAGNOSIS — D122 Benign neoplasm of ascending colon: Secondary | ICD-10-CM | POA: Diagnosis not present

## 2022-10-23 DIAGNOSIS — Z86010 Personal history of colonic polyps: Secondary | ICD-10-CM | POA: Insufficient documentation

## 2022-10-23 DIAGNOSIS — Z1211 Encounter for screening for malignant neoplasm of colon: Secondary | ICD-10-CM | POA: Insufficient documentation

## 2022-10-23 DIAGNOSIS — E039 Hypothyroidism, unspecified: Secondary | ICD-10-CM | POA: Diagnosis not present

## 2022-10-23 DIAGNOSIS — D123 Benign neoplasm of transverse colon: Secondary | ICD-10-CM | POA: Insufficient documentation

## 2022-10-23 DIAGNOSIS — Z800 Family history of malignant neoplasm of digestive organs: Secondary | ICD-10-CM | POA: Diagnosis not present

## 2022-10-23 DIAGNOSIS — Z9049 Acquired absence of other specified parts of digestive tract: Secondary | ICD-10-CM | POA: Diagnosis not present

## 2022-10-23 DIAGNOSIS — Z803 Family history of malignant neoplasm of breast: Secondary | ICD-10-CM | POA: Insufficient documentation

## 2022-10-23 DIAGNOSIS — K5730 Diverticulosis of large intestine without perforation or abscess without bleeding: Secondary | ICD-10-CM | POA: Diagnosis not present

## 2022-10-23 HISTORY — PX: COLONOSCOPY: SHX5424

## 2022-10-23 SURGERY — COLONOSCOPY
Anesthesia: General

## 2022-10-23 MED ORDER — PROPOFOL 10 MG/ML IV BOLUS
INTRAVENOUS | Status: AC
  Filled 2022-10-23: qty 20

## 2022-10-23 MED ORDER — PHENYLEPHRINE HCL (PRESSORS) 10 MG/ML IV SOLN
INTRAVENOUS | Status: DC | PRN
  Administered 2022-10-23 (×2): 80 ug via INTRAVENOUS

## 2022-10-23 MED ORDER — PROPOFOL 1000 MG/100ML IV EMUL
INTRAVENOUS | Status: AC
  Filled 2022-10-23: qty 100

## 2022-10-23 MED ORDER — LIDOCAINE HCL (CARDIAC) PF 100 MG/5ML IV SOSY
PREFILLED_SYRINGE | INTRAVENOUS | Status: DC | PRN
  Administered 2022-10-23: 50 mg via INTRAVENOUS

## 2022-10-23 MED ORDER — PROPOFOL 500 MG/50ML IV EMUL
INTRAVENOUS | Status: DC | PRN
  Administered 2022-10-23: 150 ug/kg/min via INTRAVENOUS

## 2022-10-23 MED ORDER — SODIUM CHLORIDE 0.9 % IV SOLN
INTRAVENOUS | Status: DC
  Administered 2022-10-23: 1000 mL via INTRAVENOUS

## 2022-10-23 MED ORDER — GLYCOPYRROLATE 0.2 MG/ML IJ SOLN
INTRAMUSCULAR | Status: AC
  Filled 2022-10-23: qty 1

## 2022-10-23 MED ORDER — EPHEDRINE 5 MG/ML INJ
INTRAVENOUS | Status: AC
  Filled 2022-10-23: qty 5

## 2022-10-23 MED ORDER — PROPOFOL 10 MG/ML IV BOLUS
INTRAVENOUS | Status: DC | PRN
  Administered 2022-10-23: 60 mg via INTRAVENOUS

## 2022-10-23 MED ORDER — EPHEDRINE SULFATE (PRESSORS) 50 MG/ML IJ SOLN
INTRAMUSCULAR | Status: DC | PRN
  Administered 2022-10-23 (×2): 5 mg via INTRAVENOUS

## 2022-10-23 MED ORDER — PHENYLEPHRINE 80 MCG/ML (10ML) SYRINGE FOR IV PUSH (FOR BLOOD PRESSURE SUPPORT)
PREFILLED_SYRINGE | INTRAVENOUS | Status: AC
  Filled 2022-10-23: qty 10

## 2022-10-23 MED ORDER — GLYCOPYRROLATE 0.2 MG/ML IJ SOLN
INTRAMUSCULAR | Status: DC | PRN
  Administered 2022-10-23: .2 mg via INTRAVENOUS

## 2022-10-23 MED ORDER — LIDOCAINE HCL (PF) 2 % IJ SOLN
INTRAMUSCULAR | Status: AC
  Filled 2022-10-23: qty 5

## 2022-10-23 NOTE — Interval H&P Note (Signed)
History and Physical Interval Note: Preprocedure H&P from 10/23/22  was reviewed and there was no interval change after seeing and examining the patient.  Written consent was obtained from the patient after discussion of risks, benefits, and alternatives. Patient has consented to proceed with Colonoscopy with possible intervention   10/23/2022 7:37 AM  Abigail Nguyen  has presented today for surgery, with the diagnosis of History of adenomatous polyp of colon (Z86.010).  The various methods of treatment have been discussed with the patient and family. After consideration of risks, benefits and other options for treatment, the patient has consented to  Procedure(s): COLONOSCOPY (N/A) as a surgical intervention.  The patient's history has been reviewed, patient examined, no change in status, stable for surgery.  I have reviewed the patient's chart and labs.  Questions were answered to the patient's satisfaction.     Annamaria Helling

## 2022-10-23 NOTE — Anesthesia Postprocedure Evaluation (Signed)
Anesthesia Post Note  Patient: [name]  Procedure(s) Performed: COLONOSCOPY  Patient location during evaluation: Endoscopy Anesthesia Type: General Level of consciousness: awake and alert Pain management: pain level controlled Vital Signs Assessment: post-procedure vital signs reviewed and stable Respiratory status: spontaneous breathing, nonlabored ventilation and respiratory function stable Cardiovascular status: blood pressure returned to baseline and stable Postop Assessment: no apparent nausea or vomiting Anesthetic complications: no   No notable events documented.   Last Vitals:  Vitals:   10/23/22 0841 10/23/22 0858  BP: (!) 94/54 120/68  Pulse: 77 78  Resp: 18 18  Temp:    SpO2: 100% 98%    Last Pain:  Vitals:   10/23/22 0858  TempSrc:   PainSc: 0-No pain                 Alphonsus Sias

## 2022-10-23 NOTE — Op Note (Signed)
[facility] Gastroenterology Patient Name: Abigail Nguyen Procedure Date: 10/23/2022 7:34 AM MRN: [id number] Account #: [id number] Date of Birth: 03-28-1951 Admit Type: Outpatient Age: 72 Room: [facility] ENDO ROOM 1 Gender: Female Note Status: Finalized Instrument Name: Jasper Riling [id number] Procedure:             Colonoscopy Indications:           High risk colon cancer surveillance: Personal history                         of colonic polyps, Family history CRC (mother 77 y/o) Providers:             Rueben Bash, DO Referring MD:          Ocie Cornfield. Ouida Sills MD, MD (Referring MD) Medicines:             Monitored Anesthesia Care Complications:         No immediate complications. Estimated blood loss:                         Minimal. Procedure:             Pre-Anesthesia Assessment:                        - Prior to the procedure, a History and Physical was                         performed, and patient medications and allergies were                         reviewed. The patient is competent. The risks and                         benefits of the procedure and the sedation options and                         risks were discussed with the patient. All questions                         were answered and informed consent was obtained.                         Patient identification and proposed procedure were                         verified by the physician, the nurse, the anesthetist                         and the technician in the endoscopy suite. Mental                         Status Examination: alert and oriented. Airway                         Examination: normal oropharyngeal airway and neck                         mobility. Respiratory Examination: clear to  auscultation. CV Examination: RRR, no murmurs, no S3                         or S4. Prophylactic Antibiotics: The patient does not                         require prophylactic  antibiotics. Prior                         Anticoagulants: The patient has taken no anticoagulant                         or antiplatelet agents. ASA Grade Assessment: II - A                         patient with mild systemic disease. After reviewing                         the risks and benefits, the patient was deemed in                         satisfactory condition to undergo the procedure. The                         anesthesia plan was to use monitored anesthesia care                         (MAC). Immediately prior to administration of                         medications, the patient was re-assessed for adequacy                         to receive sedatives. The heart rate, respiratory                         rate, oxygen saturations, blood pressure, adequacy of                         pulmonary ventilation, and response to care were                         monitored throughout the procedure. The physical                         status of the patient was re-assessed after the                         procedure.                        After obtaining informed consent, the colonoscope was                         passed under direct vision. Throughout the procedure,                         the patient's blood pressure, pulse, and oxygen  saturations were monitored continuously. The                         Colonoscope was introduced through the anus and                         advanced to the the cecum, identified by appendiceal                         orifice and ileocecal valve. The colonoscopy was                         performed without difficulty. The patient tolerated                         the procedure well. The quality of the bowel                         preparation was evaluated using the BBPS [facility] Bowel                         Preparation Scale) with scores of: Right Colon = 2                         (minor amount of residual staining, small  fragments of                         stool and/or opaque liquid, but mucosa seen well),                         Transverse Colon = 3 (entire mucosa seen well with no                         residual staining, small fragments of stool or opaque                         liquid) and Left Colon = 3 (entire mucosa seen well                         with no residual staining, small fragments of stool or                         opaque liquid). The total BBPS score equals 8. The                         quality of the bowel preparation was excellent. The                         ileocecal valve, appendiceal orifice, and rectum were                         photographed. Findings:      The perianal and digital rectal examinations were normal. Pertinent       negatives include normal sphincter tone.      Retroflexion in the right colon was performed.      Multiple small-mouthed diverticula were found in the left colon.  Estimated blood loss: none.      Two sessile polyps were found in the transverse colon and ascending       colon. The polyps were 1 to 2 mm in size. These polyps were removed with       a jumbo cold forceps. Resection and retrieval were complete. Estimated       blood loss was minimal.      A 5 to 6 mm polyp was found in the hepatic flexure. The polyp was       sessile. The polyp was removed with a cold snare. Resection and       retrieval were complete. Estimated blood loss was minimal.      The exam was otherwise without abnormality on direct and retroflexion       views. Impression:            - Diverticulosis in the left colon.                        - Two 1 to 2 mm polyps in the transverse colon and in                         the ascending colon, removed with a jumbo cold                         forceps. Resected and retrieved.                        - One 5 to 6 mm polyp at the hepatic flexure, removed                         with a cold snare. Resected and retrieved.                         - The examination was otherwise normal on direct and                         retroflexion views. Recommendation:        - Patient has a contact number available for                         emergencies. The signs and symptoms of potential                         delayed complications were discussed with the patient.                         Return to normal activities tomorrow. Written                         discharge instructions were provided to the patient.                        - Discharge patient to home.                        - Resume previous diet.                        - Continue present medications.                        -  No aspirin, ibuprofen, naproxen, or other                         non-steroidal anti-inflammatory drugs for 5 days after                         polyp removal.                        - Await pathology results.                        - Repeat colonoscopy for surveillance based on                         pathology results.                        - Return to referring physician as previously                         scheduled.                        - The findings and recommendations were discussed with                         the patient. Procedure Code(s):     --- Professional ---                        [phone], Colonoscopy, flexible; with removal of                         tumor(s), polyp(s), or other lesion(s) by snare                         technique                        45380, 8, Colonoscopy, flexible; with biopsy, single                         or multiple Diagnosis Code(s):     --- Professional ---                        Z86.010, Personal history of colonic polyps                        D12.3, Benign neoplasm of transverse colon (hepatic                         flexure or splenic flexure)                        D12.2, Benign neoplasm of ascending colon                        K57.30, Diverticulosis of large intestine without                          perforation or abscess without bleeding CPT copyright 2022 American Medical Association. All rights  reserved. The codes documented in this report are preliminary and upon coder review may  be revised to meet current compliance requirements. Attending Participation:      I personally performed the entire procedure. Volney American, DO Annamaria Helling DO, DO 10/23/2022 8:20:09 AM This report has been signed electronically. Number of Addenda: 0 Note Initiated On: 10/23/2022 7:34 AM Scope Withdrawal Time: 0 hours 21 minutes 9 seconds  Total Procedure Duration: 0 hours 28 minutes 21 seconds  Estimated Blood Loss:  Estimated blood loss was minimal.      [facility]

## 2022-10-23 NOTE — Transfer of Care (Signed)
Immediate Anesthesia Transfer of Care Note  Patient: Abigail Nguyen  Procedure(s) Performed: COLONOSCOPY  Patient Location: PACU  Anesthesia Type:General  Level of Consciousness: drowsy  Airway & Oxygen Therapy: Patient Spontanous Breathing  Post-op Assessment: Report given to RN  Post vital signs: Reviewed and stable Blood pressure treated by CRNA.  Last Vitals:  Vitals Value Taken Time  BP 100/59 10/23/22 0827  Temp 35.9 C 10/23/22 0821  Pulse 73 10/23/22 0826  Resp 18 10/23/22 0826  SpO2 100 % 10/23/22 0826  Vitals shown include unvalidated device data.  Last Pain:  Vitals:   10/23/22 0821  TempSrc: Temporal  PainSc: 0-No pain         Complications: No notable events documented.

## 2022-10-23 NOTE — Anesthesia Preprocedure Evaluation (Signed)
Anesthesia Evaluation  Patient identified by MRN, date of birth, ID band Patient awake    Reviewed: Allergy & Precautions, NPO status , Patient's Chart, lab work & pertinent test results  Airway Mallampati: II  TM Distance: >3 FB Neck ROM: full    Dental  (+) Chipped Discolored teeth:   Pulmonary neg pulmonary ROS   Pulmonary exam normal        Cardiovascular hypertension, negative cardio ROS Normal cardiovascular exam     Neuro/Psych negative neurological ROS  negative psych ROS   GI/Hepatic negative GI ROS, Neg liver ROS,,,  Endo/Other  negative endocrine ROSHypothyroidism    Renal/GU negative Renal ROS  negative genitourinary   Musculoskeletal   Abdominal   Peds  Hematology negative hematology ROS (+)   Anesthesia Other Findings Past Medical History: No date: Breast cyst No date: Hypertension No date: Hypothyroidism No date: Thyroid disease  Past Surgical History: No date: ANKLE SURGERY No date: APPENDECTOMY 1980's: BREAST CYST ASPIRATION; Right     Comment:  neg No date: CESAREAN SECTION No date: CHOLECYSTECTOMY 02/23/2017: COLONOSCOPY WITH PROPOFOL; N/A     Comment:  Procedure: COLONOSCOPY WITH PROPOFOL;  Surgeon: Manya Silvas, MD;  Location: [facility] ENDOSCOPY;  Service:               Endoscopy;  Laterality: N/A; No date: EYE SURGERY 1981: laparascopy; N/A     Comment:  diagnostic No date: TONSILLECTOMY     Reproductive/Obstetrics negative OB ROS                             Anesthesia Physical Anesthesia Plan  ASA: 2  Anesthesia Plan: General   Post-op Pain Management:    Induction: Intravenous  PONV Risk Score and Plan: Propofol infusion and TIVA  Airway Management Planned: Natural Airway and Nasal Cannula  Additional Equipment:   Intra-op Plan:   Post-operative Plan:   Informed Consent: I have reviewed the patients History and Physical,  chart, labs and discussed the procedure including the risks, benefits and alternatives for the proposed anesthesia with the patient or authorized representative who has indicated his/her understanding and acceptance.     Dental Advisory Given  Plan Discussed with: Anesthesiologist, CRNA and Surgeon  Anesthesia Plan Comments: (Patient consented for risks of anesthesia including but not limited to:  - adverse reactions to medications - risk of airway placement if required - damage to eyes, teeth, lips or other oral mucosa - nerve damage due to positioning  - sore throat or hoarseness - Damage to heart, brain, nerves, lungs, other parts of body or loss of life  Patient voiced understanding.)        Anesthesia Quick Evaluation

## 2022-10-24 ENCOUNTER — Encounter: Payer: Self-pay | Admitting: Gastroenterology

## 2022-10-24 LAB — SURGICAL PATHOLOGY

## 2022-10-29 ENCOUNTER — Ambulatory Visit: Admit: 2022-10-29 | Payer: Medicare PPO

## 2023-01-22 ENCOUNTER — Encounter: Payer: Self-pay | Admitting: Ophthalmology

## 2023-01-26 NOTE — Discharge Instructions (Signed)

## 2023-01-27 ENCOUNTER — Encounter
Admission: RE | Disposition: A | Discharge status: Home / Self Care | Payer: Self-pay | Source: Home / Self Care | Attending: Ophthalmology

## 2023-01-27 ENCOUNTER — Ambulatory Visit: Payer: Medicare PPO | Admitting: Anesthesiology

## 2023-01-27 ENCOUNTER — Encounter: Payer: Self-pay | Admitting: Ophthalmology

## 2023-01-27 ENCOUNTER — Ambulatory Visit
Admission: RE | Admit: 2023-01-27 | Discharge: 2023-01-27 | Disposition: A | Discharge status: Home / Self Care | Payer: Medicare PPO | Attending: Ophthalmology | Admitting: Ophthalmology

## 2023-01-27 ENCOUNTER — Other Ambulatory Visit: Payer: Self-pay

## 2023-01-27 DIAGNOSIS — E039 Hypothyroidism, unspecified: Secondary | ICD-10-CM | POA: Diagnosis not present

## 2023-01-27 DIAGNOSIS — H2512 Age-related nuclear cataract, left eye: Secondary | ICD-10-CM | POA: Diagnosis not present

## 2023-01-27 DIAGNOSIS — I10 Essential (primary) hypertension: Secondary | ICD-10-CM | POA: Insufficient documentation

## 2023-01-27 HISTORY — PX: CATARACT EXTRACTION W/PHACO: SHX586

## 2023-01-27 HISTORY — DX: Ventricular premature depolarization: I49.3

## 2023-01-27 SURGERY — PHACOEMULSIFICATION, CATARACT, WITH IOL INSERTION
Anesthesia: Monitor Anesthesia Care | Site: Eye | Laterality: Left

## 2023-01-27 MED ORDER — MOXIFLOXACIN HCL 0.5 % OP SOLN
OPHTHALMIC | Status: DC | PRN
  Administered 2023-01-27: .2 mL via OPHTHALMIC

## 2023-01-27 MED ORDER — SIGHTPATH DOSE#1 BSS IO SOLN
INTRAOCULAR | Status: DC | PRN
  Administered 2023-01-27: 1 mL via INTRAMUSCULAR

## 2023-01-27 MED ORDER — TETRACAINE HCL 0.5 % OP SOLN
1.0000 [drp] | OPHTHALMIC | Status: DC | PRN
  Administered 2023-01-27 (×3): 1 [drp] via OPHTHALMIC

## 2023-01-27 MED ORDER — SIGHTPATH DOSE#1 BSS IO SOLN
INTRAOCULAR | Status: DC | PRN
  Administered 2023-01-27: 63 mL via OPHTHALMIC

## 2023-01-27 MED ORDER — LACTATED RINGERS IV SOLN: INTRAVENOUS | Status: DC

## 2023-01-27 MED ORDER — SIGHTPATH DOSE#1 BSS IO SOLN
INTRAOCULAR | Status: DC | PRN
  Administered 2023-01-27: 15 mL

## 2023-01-27 MED ORDER — SIGHTPATH DOSE#1 NA CHONDROIT SULF-NA HYALURON 40-17 MG/ML IO SOLN
INTRAOCULAR | Status: DC | PRN
  Administered 2023-01-27: 1 mL via INTRAOCULAR

## 2023-01-27 MED ORDER — MIDAZOLAM HCL 2 MG/2ML IJ SOLN
INTRAMUSCULAR | Status: DC | PRN
  Administered 2023-01-27: 2 mg via INTRAVENOUS

## 2023-01-27 MED ORDER — ARMC OPHTHALMIC DILATING DROPS
1.0000 | OPHTHALMIC | Status: DC | PRN
  Administered 2023-01-27 (×3): 1 via OPHTHALMIC

## 2023-01-27 MED ORDER — BRIMONIDINE TARTRATE-TIMOLOL 0.2-0.5 % OP SOLN
OPHTHALMIC | Status: DC | PRN
  Administered 2023-01-27: 1 [drp] via OPHTHALMIC

## 2023-01-27 SURGICAL SUPPLY — 12 items
ANGLE REVERSE CUT SHRT 25GA (CUTTER) ×1
CATARACT SUITE SIGHTPATH (MISCELLANEOUS) ×1 IMPLANT
CYSTOTOME ANGL RVRS SHRT 25G (CUTTER) ×1 IMPLANT
CYSTOTOME ANGL RVRS SHRT 25GA (CUTTER) ×1 IMPLANT
FEE CATARACT SUITE SIGHTPATH (MISCELLANEOUS) ×1 IMPLANT
GLOVE BIOGEL PI IND STRL 8 (GLOVE) ×1 IMPLANT
GLOVE SURG ENC TEXT LTX SZ8 (GLOVE) ×1 IMPLANT
LENS IOL CLRN TRC 5 15.5 IMPLANT
LENS IOL TORIC 15.5 ×1 IMPLANT
NDL FILTER BLUNT 18X1 1/2 (NEEDLE) ×1 IMPLANT
NEEDLE FILTER BLUNT 18X1 1/2 (NEEDLE) ×1 IMPLANT
SYR 3ML LL SCALE MARK (SYRINGE) ×1 IMPLANT

## 2023-01-27 NOTE — Op Note (Signed)
PREOPERATIVE DIAGNOSIS:  Nuclear sclerotic cataract of the left eye.   POSTOPERATIVE DIAGNOSIS:  Nuclear sclerotic cataract of the left eye.   OPERATIVE PROCEDURE: Procedure(s): CATARACT EXTRACTION PHACO AND INTRAOCULAR LENS PLACEMENT (IOC) LEFT  10.83  01:04.0   SURGEON:  Galen Manila, MD.   ANESTHESIA: 1.      Managed anesthesia care. 2.     0.46ml os Shugarcaine was instilled following the paracentesis [email or web address]   COMPLICATIONS:  None.   TECHNIQUE:   Stop and chop    DESCRIPTION OF PROCEDURE:  The patient was examined and consented in the preoperative holding area where the aforementioned topical anesthesia was applied to the left eye.  The patient was brought back to the Operating Room where he was sat upright on the gurney and given a target to fixate upon while the eye was marked at the 3:00 and 9:00 position.  The patient was then reclined on the operating table.  The eye was prepped and draped in the usual sterile ophthalmic fashion and a lid speculum was placed. A paracentesis was created with the side port blade and the anterior chamber was filled with viscoelastic. A near clear corneal incision was performed with the steel keratome. A continuous curvilinear capsulorrhexis was performed with a cystotome followed by the capsulorrhexis forceps. Hydrodissection and hydrodelineation were carried out with BSS on a blunt cannula. The lens was removed in a stop and chop technique and the remaining cortical material was removed with the irrigation-aspiration handpiece. The eye was inflated with viscoelastic and the ZCT lens was placed in the eye and rotated to within a few degrees of the predetermined orientation.  The remaining viscoelastic was removed from the eye.  The Sinskey hook was used to rotate the toric lens into its final resting place at 068 degrees.  0.1 ml of Vigamox was placed in the anterior chamber. The eye was inflated to a physiologic pressure and found to be watertight.   The eye was dressed with Vigamox andf Combigan.. The patient was given protective glasses to wear throughout the day and a shield with which to sleep tonight. The patient was also given drops with which to begin a drop regimen today and will follow-up with me in one day. Implant Name Type Inv. Item Serial No. Manufacturer Lot No. LRB No. Used Action  CLAREON TORIC IOL Intraocular Lens  [id number] ALCON  Left 1 Implanted   Procedure(s): CATARACT EXTRACTION PHACO AND INTRAOCULAR LENS PLACEMENT (IOC) LEFT  10.83  01:04.0 (Left)  Electronically signed: Galen Manila 5/14/202411:32 AM

## 2023-01-27 NOTE — H&P (Signed)
[facility]   Primary Care Physician:  Lauro Regulus, MD Ophthalmologist: Dr. Maren Reamer  Pre-Procedure History & Physical: HPI:  Abigail Nguyen is a 73 y.o. female here for cataract surgery.   Past Medical History:  Diagnosis Date   Breast cyst    Hypertension    Hypothyroidism    PVCs (premature ventricular contractions)    Thyroid disease     Past Surgical History:  Procedure Laterality Date   ANKLE SURGERY     APPENDECTOMY     BREAST CYST ASPIRATION Right 1980's   neg   CESAREAN SECTION     CHOLECYSTECTOMY     COLONOSCOPY N/A 10/23/2022   Procedure: COLONOSCOPY;  Surgeon: Jaynie Collins, DO;  Location: [facility] ENDOSCOPY;  Service: Gastroenterology;  Laterality: N/A;   COLONOSCOPY WITH PROPOFOL N/A 02/23/2017   Procedure: COLONOSCOPY WITH PROPOFOL;  Surgeon: Scot Jun, MD;  Location: [facility] ENDOSCOPY;  Service: Endoscopy;  Laterality: N/A;   EYE SURGERY     laparascopy N/A 1981   diagnostic   TONSILLECTOMY      Prior to Admission medications   Medication Sig Start Date End Date Taking? Authorizing Provider  carvedilol (COREG) 3.125 MG tablet Take by mouth. 12/07/18  Yes [provider]  levothyroxine (SYNTHROID, LEVOTHROID) 25 MCG tablet Take 25 mcg by mouth daily before breakfast.   Yes [provider]  lisinopril (PRINIVIL,ZESTRIL) 10 MG tablet Take 10 mg by mouth daily.   Yes [provider]  calcium carbonate (TUMS - DOSED IN MG ELEMENTAL CALCIUM) 500 MG chewable tablet Chew 1 tablet by mouth daily.    [provider]    Allergies as of 01/08/2023 - Review Complete 10/23/2022  Allergen Reaction Noted   Codeine Other (See Comments) 12/02/2016   Compazine [prochlorperazine edisylate] Nausea And Vomiting 06/28/2015   Morphine and related Nausea And Vomiting 06/28/2015    Family History  Problem Relation Age of Onset   Colon cancer Mother    Coronary artery disease Father    Stroke Father    Breast  cancer Maternal Grandmother 33    Social History   Socioeconomic History   Marital status: Married    Spouse name: Not on file   Number of children: Not on file   Years of education: Not on file   Highest education level: Not on file  Occupational History   Not on file  Tobacco Use   Smoking status: Never   Smokeless tobacco: Never  Vaping Use   Vaping Use: Never used  Substance and Sexual Activity   Alcohol use: Not Currently    Comment: rarely   Drug use: No   Sexual activity: Not on file  Other Topics Concern   Not on file  Social History Narrative   Not on file   Social Determinants of Health   Financial Resource Strain: Not on file  Food Insecurity: Not on file  Transportation Needs: Not on file  Physical Activity: Not on file  Stress: Not on file  Social Connections: Not on file  Intimate Partner Violence: Not on file    Review of Systems: See HPI, otherwise negative ROS  Physical Exam: BP (!) 145/75   Pulse 72   Temp (!) 97 F (36.1 C) (Temporal)   Resp 18   Ht 5\' 4"  (1.626 m)   Wt 83 kg   SpO2 100%   BMI 31.41 kg/m  General:   Alert, cooperative in NAD Head:  Normocephalic and atraumatic. Respiratory:  Normal work of breathing. Cardiovascular:  RRR  Impression/Plan: Abigail Nguyen is here for cataract surgery.  Risks, benefits, limitations, and alternatives regarding cataract surgery have been reviewed with the patient.  Questions have been answered.  All parties agreeable.   Galen Manila, MD  01/27/2023, 11:04 AM

## 2023-01-27 NOTE — Anesthesia Postprocedure Evaluation (Signed)
Anesthesia Post Note  Patient: Abigail Nguyen  Procedure(s) Performed: CATARACT EXTRACTION PHACO AND INTRAOCULAR LENS PLACEMENT (IOC) LEFT  10.83  01:04.0 (Left: Eye)  Patient location during evaluation: PACU Anesthesia Type: MAC Level of consciousness: awake and alert Pain management: pain level controlled Vital Signs Assessment: post-procedure vital signs reviewed and stable Respiratory status: spontaneous breathing, nonlabored ventilation, respiratory function stable and patient connected to nasal cannula oxygen Cardiovascular status: stable and blood pressure returned to baseline Postop Assessment: no apparent nausea or vomiting Anesthetic complications: no   No notable events documented.   Last Vitals:  Vitals:   01/27/23 1136 01/27/23 1141  BP: 113/66 (!) 119/59  Pulse: 73 66  Resp: 16 16  Temp:  (!) 36.1 C  SpO2: 100% 99%    Last Pain:  Vitals:   01/27/23 1141  TempSrc:   PainSc: 0-No pain                 Eivan Gallina C Adriann Ballweg

## 2023-01-27 NOTE — Transfer of Care (Signed)
Immediate Anesthesia Transfer of Care Note  Patient: Abigail Nguyen  Procedure(s) Performed: CATARACT EXTRACTION PHACO AND INTRAOCULAR LENS PLACEMENT (IOC) LEFT  10.83  01:04.0 (Left: Eye)  Patient Location: PACU  Anesthesia Type: MAC  Level of Consciousness: awake, alert  and patient cooperative  Airway and Oxygen Therapy: Patient Spontanous Breathing and Patient connected to supplemental oxygen  Post-op Assessment: Post-op Vital signs reviewed, Patient's Cardiovascular Status Stable, Respiratory Function Stable, Patent Airway and No signs of Nausea or vomiting  Post-op Vital Signs: Reviewed and stable  Complications: No notable events documented.

## 2023-01-27 NOTE — Anesthesia Preprocedure Evaluation (Addendum)
Anesthesia Evaluation  Patient identified by MRN, date of birth, ID band Patient awake    Reviewed: Allergy & Precautions, H&P , NPO status , Patient's Chart, lab work & pertinent test results, reviewed documented beta blocker date and time   Airway Mallampati: II  TM Distance: <3 FB Neck ROM: Full    Dental no notable dental hx.    Pulmonary neg pulmonary ROS   Pulmonary exam normal breath sounds clear to auscultation       Cardiovascular hypertension, Pt. on home beta blockers and Pt. on medications Normal cardiovascular exam Rhythm:Regular Rate:Normal  Hx PVCs    Neuro/Psych negative neurological ROS  negative psych ROS   GI/Hepatic negative GI ROS, Neg liver ROS,,,  Endo/Other  Hypothyroidism    Renal/GU negative Renal ROS  negative genitourinary   Musculoskeletal negative musculoskeletal ROS (+)    Abdominal   Peds negative pediatric ROS (+)  Hematology negative hematology ROS (+)   Anesthesia Other Findings Hypertension  Thyroid disease Hypothyroidism  Breast cyst PVCs (premature ventricular contractions)     Reproductive/Obstetrics negative OB ROS                             Anesthesia Physical Anesthesia Plan  ASA: 2  Anesthesia Plan: MAC   Post-op Pain Management:    Induction: Intravenous  PONV Risk Score and Plan:   Airway Management Planned: Natural Airway and Nasal Cannula  Additional Equipment:   Intra-op Plan:   Post-operative Plan:   Informed Consent: I have reviewed the patients History and Physical, chart, labs and discussed the procedure including the risks, benefits and alternatives for the proposed anesthesia with the patient or authorized representative who has indicated his/her understanding and acceptance.     Dental Advisory Given  Plan Discussed with: Anesthesiologist, CRNA and Surgeon  Anesthesia Plan Comments: (Patient consented for  risks of anesthesia including but not limited to:  - adverse reactions to medications - damage to eyes, teeth, lips or other oral mucosa - nerve damage due to positioning  - sore throat or hoarseness - Damage to heart, brain, nerves, lungs, other parts of body or loss of life  Patient voiced understanding.)       Anesthesia Quick Evaluation

## 2023-01-28 ENCOUNTER — Other Ambulatory Visit: Payer: Self-pay

## 2023-01-28 ENCOUNTER — Encounter: Payer: Self-pay | Admitting: Ophthalmology

## 2023-04-30 ENCOUNTER — Encounter: Payer: Self-pay | Admitting: Ophthalmology

## 2023-04-30 NOTE — Anesthesia Preprocedure Evaluation (Addendum)
Anesthesia Evaluation  Patient identified by MRN, date of birth, ID band Patient awake    Reviewed: Allergy & Precautions, H&P , NPO status , Patient's Chart, lab work & pertinent test results  Airway Mallampati: II  TM Distance: >3 FB Neck ROM: Full    Dental no notable dental hx.    Pulmonary neg pulmonary ROS   Pulmonary exam normal breath sounds clear to auscultation       Cardiovascular hypertension, negative cardio ROS Normal cardiovascular exam Rhythm:Regular Rate:Normal     Neuro/Psych negative neurological ROS  negative psych ROS   GI/Hepatic negative GI ROS, Neg liver ROS,,,  Endo/Other  negative endocrine ROSHypothyroidism    Renal/GU negative Renal ROS  negative genitourinary   Musculoskeletal negative musculoskeletal ROS (+)    Abdominal   Peds negative pediatric ROS (+)  Hematology negative hematology ROS (+)   Anesthesia Other Findings Hypertension  Thyroid disease Hypothyroidism  Breast cyst PVCs (premature ventricular contractions)   Previous cataract surgery 01-27-23     Reproductive/Obstetrics negative OB ROS                              Anesthesia Physical Anesthesia Plan  ASA: 3  Anesthesia Plan: MAC   Post-op Pain Management:    Induction: Intravenous  PONV Risk Score and Plan:   Airway Management Planned: Natural Airway and Nasal Cannula  Additional Equipment:   Intra-op Plan:   Post-operative Plan:   Informed Consent: I have reviewed the patients History and Physical, chart, labs and discussed the procedure including the risks, benefits and alternatives for the proposed anesthesia with the patient or authorized representative who has indicated his/her understanding and acceptance.     Dental Advisory Given  Plan Discussed with: Anesthesiologist, CRNA and Surgeon  Anesthesia Plan Comments: (Patient consented for risks of anesthesia  including but not limited to:  - adverse reactions to medications - damage to eyes, teeth, lips or other oral mucosa - nerve damage due to positioning  - sore throat or hoarseness - Damage to heart, brain, nerves, lungs, other parts of body or loss of life  Patient voiced understanding.)         Anesthesia Quick Evaluation

## 2023-05-01 NOTE — Discharge Instructions (Signed)

## 2023-05-05 ENCOUNTER — Ambulatory Visit: Payer: Medicare PPO | Admitting: Anesthesiology

## 2023-05-05 ENCOUNTER — Encounter
Admission: RE | Disposition: A | Discharge status: Home / Self Care | Payer: Self-pay | Source: Home / Self Care | Attending: Ophthalmology

## 2023-05-05 ENCOUNTER — Other Ambulatory Visit: Payer: Self-pay

## 2023-05-05 ENCOUNTER — Ambulatory Visit
Admission: RE | Admit: 2023-05-05 | Discharge: 2023-05-05 | Disposition: A | Discharge status: Home / Self Care | Payer: Medicare PPO | Source: Home / Self Care | Attending: Ophthalmology | Admitting: Ophthalmology

## 2023-05-05 ENCOUNTER — Encounter: Payer: Self-pay | Admitting: Ophthalmology

## 2023-05-05 DIAGNOSIS — I493 Ventricular premature depolarization: Secondary | ICD-10-CM | POA: Diagnosis not present

## 2023-05-05 DIAGNOSIS — I10 Essential (primary) hypertension: Secondary | ICD-10-CM | POA: Insufficient documentation

## 2023-05-05 DIAGNOSIS — Z9842 Cataract extraction status, left eye: Secondary | ICD-10-CM | POA: Insufficient documentation

## 2023-05-05 DIAGNOSIS — H2511 Age-related nuclear cataract, right eye: Secondary | ICD-10-CM | POA: Diagnosis present

## 2023-05-05 DIAGNOSIS — E039 Hypothyroidism, unspecified: Secondary | ICD-10-CM | POA: Diagnosis not present

## 2023-05-05 HISTORY — PX: CATARACT EXTRACTION W/PHACO: SHX586

## 2023-05-05 SURGERY — PHACOEMULSIFICATION, CATARACT, WITH IOL INSERTION
Anesthesia: Monitor Anesthesia Care | Site: Eye | Laterality: Right

## 2023-05-05 MED ORDER — SIGHTPATH DOSE#1 BSS IO SOLN
INTRAOCULAR | Status: DC | PRN
  Administered 2023-05-05: 46 mL via OPHTHALMIC

## 2023-05-05 MED ORDER — FENTANYL CITRATE (PF) 100 MCG/2ML IJ SOLN
INTRAMUSCULAR | Status: DC | PRN
  Administered 2023-05-05: 50 ug via INTRAVENOUS

## 2023-05-05 MED ORDER — BRIMONIDINE TARTRATE-TIMOLOL 0.2-0.5 % OP SOLN
OPHTHALMIC | Status: DC | PRN
  Administered 2023-05-05: 1 [drp] via OPHTHALMIC

## 2023-05-05 MED ORDER — MOXIFLOXACIN HCL 0.5 % OP SOLN
OPHTHALMIC | Status: DC | PRN
  Administered 2023-05-05: .2 mL via OPHTHALMIC

## 2023-05-05 MED ORDER — ARMC OPHTHALMIC DILATING DROPS
1.0000 | OPHTHALMIC | Status: DC | PRN
  Administered 2023-05-05 (×3): 1 via OPHTHALMIC

## 2023-05-05 MED ORDER — SIGHTPATH DOSE#1 NA CHONDROIT SULF-NA HYALURON 40-17 MG/ML IO SOLN
INTRAOCULAR | Status: DC | PRN
  Administered 2023-05-05: 1 mL via INTRAOCULAR

## 2023-05-05 MED ORDER — SIGHTPATH DOSE#1 BSS IO SOLN
INTRAOCULAR | Status: DC | PRN
  Administered 2023-05-05: 15 mL via INTRAOCULAR

## 2023-05-05 MED ORDER — LACTATED RINGERS IV SOLN: INTRAVENOUS | Status: DC

## 2023-05-05 MED ORDER — MIDAZOLAM HCL 2 MG/2ML IJ SOLN
INTRAMUSCULAR | Status: DC | PRN
  Administered 2023-05-05 (×2): 1 mg via INTRAVENOUS

## 2023-05-05 MED ORDER — SIGHTPATH DOSE#1 BSS IO SOLN
INTRAOCULAR | Status: DC | PRN
  Administered 2023-05-05: 2 mL

## 2023-05-05 MED ORDER — TETRACAINE HCL 0.5 % OP SOLN
1.0000 [drp] | OPHTHALMIC | Status: DC | PRN
  Administered 2023-05-05 (×3): 1 [drp] via OPHTHALMIC

## 2023-05-05 SURGICAL SUPPLY — 14 items
ANGLE REVERSE CUT SHRT 25GA (CUTTER) ×1
CANNULA ANT/CHMB 27G (MISCELLANEOUS) IMPLANT
CANNULA ANT/CHMB 27GA (MISCELLANEOUS) IMPLANT
CATARACT SUITE SIGHTPATH (MISCELLANEOUS) ×1 IMPLANT
CYSTOTOME ANGL RVRS SHRT 25G (CUTTER) ×1 IMPLANT
CYSTOTOME ANGL RVRS SHRT 25GA (CUTTER) ×1 IMPLANT
FEE CATARACT SUITE SIGHTPATH (MISCELLANEOUS) ×1 IMPLANT
GLOVE BIOGEL PI IND STRL 8 (GLOVE) ×1 IMPLANT
GLOVE SURG LX STRL 8.0 MICRO (GLOVE) ×1 IMPLANT
LENS CLAREON TORIC 14.5 ×1 IMPLANT
LENS IOL CLRN TRC 3 14.5 IMPLANT
NDL FILTER BLUNT 18X1 1/2 (NEEDLE) ×1 IMPLANT
NEEDLE FILTER BLUNT 18X1 1/2 (NEEDLE) ×1 IMPLANT
SYR 3ML LL SCALE MARK (SYRINGE) ×1 IMPLANT

## 2023-05-05 NOTE — Anesthesia Postprocedure Evaluation (Signed)
Anesthesia Post Note  Patient: Abigail Nguyen  Procedure(s) Performed: CATARACT EXTRACTION PHACO AND INTRAOCULAR LENS PLACEMENT (IOC) RIGHT 6.70 00:43.1 (Right: Eye)  Patient location during evaluation: PACU Anesthesia Type: MAC Level of consciousness: awake and alert Pain management: pain level controlled Vital Signs Assessment: post-procedure vital signs reviewed and stable Respiratory status: spontaneous breathing, nonlabored ventilation, respiratory function stable and patient connected to nasal cannula oxygen Cardiovascular status: stable and blood pressure returned to baseline Postop Assessment: no apparent nausea or vomiting Anesthetic complications: no   No notable events documented.   Last Vitals:  Vitals:   05/05/23 1115 05/05/23 1118  BP: 111/66 117/66  Pulse: 71 71  Resp: 13 20  Temp:  (!) 36.3 C  SpO2: 98% 98%    Last Pain:  Vitals:   05/05/23 1118  TempSrc:   PainSc: 0-No pain                 Murvin Gift C [name]

## 2023-05-05 NOTE — Transfer of Care (Signed)
Immediate Anesthesia Transfer of Care Note  Patient: Abigail Nguyen  Procedure(s) Performed: CATARACT EXTRACTION PHACO AND INTRAOCULAR LENS PLACEMENT (IOC) RIGHT 6.70 00:43.1 (Right: Eye)  Patient Location: PACU  Anesthesia Type: MAC  Level of Consciousness: awake, alert  and patient cooperative  Airway and Oxygen Therapy: Patient Spontanous Breathing and Patient connected to supplemental oxygen  Post-op Assessment: Post-op Vital signs reviewed, Patient's Cardiovascular Status Stable, Respiratory Function Stable, Patent Airway and No signs of Nausea or vomiting  Post-op Vital Signs: Reviewed and stable  Complications: No notable events documented.

## 2023-05-05 NOTE — Op Note (Signed)
PREOPERATIVE DIAGNOSIS:  Nuclear sclerotic cataract of the right eye.   POSTOPERATIVE DIAGNOSIS:  Nuclear sclerotic cataract of the right eye.   OPERATIVE PROCEDURE: Procedure(s): CATARACT EXTRACTION PHACO AND INTRAOCULAR LENS PLACEMENT (IOC) RIGHT 6.70 00:43.1   SURGEON:  Galen Manila, MD.   ANESTHESIA: 1.      Managed anesthesia care. 2.     0.78ml of Shugarcaine was instilled following the paracentesis  Anesthesiologist: Marisue Humble, MD CRNA: Barbette Hair, CRNA  COMPLICATIONS:  None.   TECHNIQUE:   Stop and chop    DESCRIPTION OF PROCEDURE:  The patient was examined and consented in the preoperative holding area where the aforementioned topical anesthesia was applied to the right eye.  The patient was brought back to the Operating Room where he was sat upright on the gurney and given a target to fixate upon while the eye was marked at the 3:00 and 9:00 position.  The patient was then reclined on the operating table.  The eye was prepped and draped in the usual sterile ophthalmic fashion and a lid speculum was placed. A paracentesis was created with the side port blade and the anterior chamber was filled with viscoelastic. A near clear corneal incision was performed with the steel keratome. A continuous curvilinear capsulorrhexis was performed with a cystotome followed by the capsulorrhexis forceps. Hydrodissection and hydrodelineation were carried out with BSS on a blunt cannula. The lens was removed in a stop and chop technique and the remaining cortical material was removed with the irrigation-aspiration handpiece. The eye was inflated with viscoelastic and the CNWOT  lens  was placed in the eye and rotated to within a few degrees of the predetermined orientation.  The remaining viscoelastic was removed from the eye.  The Sinskey hook was used to rotate the toric lens into its final resting place at 112 degrees.  0. The eye was inflated to a physiologic pressure and found to be  watertight. 0.15ml of Vigamox was placed in the anterior chamber.  The eye was dressed with Vigamox. And Combigan.The patient was given protective glasses to wear throughout the day and a shield with which to sleep tonight. The patient was also given drops with which to begin a drop regimen today and will follow-up with me in one day. Implant Name Type Inv. Item Serial No. Manufacturer Lot No. LRB No. Used Action  LENS CLAREON TORIC 14.5 - [id number]  LENS CLAREON TORIC 14.5 [id number] [employer]  Right 1 Implanted   Procedure(s): CATARACT EXTRACTION PHACO AND INTRAOCULAR LENS PLACEMENT (IOC) RIGHT 6.70 00:43.1 (Right)  Electronically signed: Galen Manila 05/05/2023 11:12 AM

## 2023-05-05 NOTE — H&P (Signed)
[facility]   Primary Care Physician:  Lauro Regulus, MD Ophthalmologist: Dr. Druscilla Brownie  Pre-Procedure History & Physical: HPI:  Abigail Nguyen is a 72 y.o. female here for cataract surgery.   Past Medical History:  Diagnosis Date   Breast cyst    Hypertension    Hypothyroidism    PVCs (premature ventricular contractions)    Thyroid disease     Past Surgical History:  Procedure Laterality Date   ANKLE SURGERY     APPENDECTOMY     BREAST CYST ASPIRATION Right 1980's   neg   CATARACT EXTRACTION W/PHACO Left 01/27/2023   Procedure: CATARACT EXTRACTION PHACO AND INTRAOCULAR LENS PLACEMENT (IOC) LEFT  10.83  01:04.0;  Surgeon: Galen Manila, MD;  Location: [facility];  Service: Ophthalmology;  Laterality: Left;   CESAREAN SECTION     CHOLECYSTECTOMY     COLONOSCOPY N/A 10/23/2022   Procedure: COLONOSCOPY;  Surgeon: Jaynie Collins, DO;  Location: [facility] ENDOSCOPY;  Service: Gastroenterology;  Laterality: N/A;   COLONOSCOPY WITH PROPOFOL N/A 02/23/2017   Procedure: COLONOSCOPY WITH PROPOFOL;  Surgeon: Scot Jun, MD;  Location: [facility] ENDOSCOPY;  Service: Endoscopy;  Laterality: N/A;   EYE SURGERY     laparascopy N/A 1981   diagnostic   TONSILLECTOMY      Prior to Admission medications   Medication Sig Start Date End Date Taking? Authorizing Provider  calcium carbonate (TUMS - DOSED IN MG ELEMENTAL CALCIUM) 500 MG chewable tablet Chew 1 tablet by mouth daily.   Yes [provider]  carvedilol (COREG) 3.125 MG tablet Take by mouth. 12/07/18  Yes [provider]  levothyroxine (SYNTHROID, LEVOTHROID) 25 MCG tablet Take 25 mcg by mouth daily before breakfast.   Yes [provider]  lisinopril (PRINIVIL,ZESTRIL) 10 MG tablet Take 10 mg by mouth daily.   Yes [provider]    Allergies as of 01/08/2023 - Review Complete 10/23/2022  Allergen Reaction Noted   Codeine Other (See Comments) 12/02/2016   Compazine  [prochlorperazine edisylate] Nausea And Vomiting 06/28/2015   Morphine and codeine Nausea And Vomiting 06/28/2015    Family History  Problem Relation Age of Onset   Colon cancer Mother    Coronary artery disease Father    Stroke Father    Breast cancer Maternal Grandmother 51    Social History   Socioeconomic History   Marital status: Married    Spouse name: Not on file   Number of children: Not on file   Years of education: Not on file   Highest education level: Not on file  Occupational History   Not on file  Tobacco Use   Smoking status: Never   Smokeless tobacco: Never  Vaping Use   Vaping status: Never Used  Substance and Sexual Activity   Alcohol use: Not Currently    Comment: rarely   Drug use: No   Sexual activity: Not on file  Other Topics Concern   Not on file  Social History Narrative   Not on file   Social Determinants of Health   Financial Resource Strain: Low Risk  (11/11/2022)   Received from [facility], [facility]   Overall Financial Resource Strain (CARDIA)    Difficulty of Paying Living Expenses: Not hard at all  Food Insecurity: No Food Insecurity (11/11/2022)   Received from [facility], [facility]   Hunger Vital Sign    Worried About Running Out of Food in the  Last Year: Never true    Ran Out of Food in the Last Year: Never true  Transportation Needs: No Transportation Needs (11/11/2022)   Received [facility], [facility]   [facility] - Transportation    In the past 12 months, has lack of transportation kept you from medical appointments or from getting medications?: No    Lack of Transportation (Non-Medical): No  Physical Activity: Not on file  Stress: Not on file  Social Connections: Not on file  Intimate Partner Violence: Not on file    Review of Systems: See HPI, otherwise negative ROS  Physical Exam: BP (!) 143/81    Pulse 68   Temp (!) 97.2 F (36.2 C) (Temporal)   Resp 18   Ht 5\' 4"  (1.626 m)   Wt 82.1 kg   SpO2 98%   BMI 31.07 kg/m  General:   Alert, cooperative in NAD Head:  Normocephalic and atraumatic. Respiratory:  Normal work of breathing. Cardiovascular:  RRR  Impression/Plan: Abigail Nguyen is here for cataract surgery.  Risks, benefits, limitations, and alternatives regarding cataract surgery have been reviewed with the patient.  Questions have been answered.  All parties agreeable.   Galen Manila, MD  05/05/2023, 10:47 AM

## 2023-05-06 ENCOUNTER — Encounter: Payer: Self-pay | Admitting: Ophthalmology

## 2023-08-31 ENCOUNTER — Other Ambulatory Visit: Payer: Self-pay | Admitting: Internal Medicine

## 2023-08-31 DIAGNOSIS — Z1231 Encounter for screening mammogram for malignant neoplasm of breast: Secondary | ICD-10-CM

## 2023-09-14 ENCOUNTER — Ambulatory Visit
Admission: RE | Admit: 2023-09-14 | Discharge: 2023-09-14 | Disposition: A | Discharge status: Home / Self Care | Payer: Medicare PPO | Source: Ambulatory Visit | Attending: Internal Medicine | Admitting: Internal Medicine

## 2023-09-14 DIAGNOSIS — Z1231 Encounter for screening mammogram for malignant neoplasm of breast: Secondary | ICD-10-CM | POA: Diagnosis present

## 2024-09-05 ENCOUNTER — Ambulatory Visit
Admission: EM | Admit: 2024-09-05 | Discharge: 2024-09-05 | Disposition: A | Discharge status: Home / Self Care | Attending: Emergency Medicine | Admitting: Emergency Medicine

## 2024-09-05 DIAGNOSIS — J101 Influenza due to other identified influenza virus with other respiratory manifestations: Secondary | ICD-10-CM | POA: Diagnosis not present

## 2024-09-05 LAB — POC COVID19/FLU A&B COMBO
Covid Antigen, POC: NEGATIVE
Influenza A Antigen, POC: POSITIVE — AB
Influenza B Antigen, POC: NEGATIVE

## 2024-09-05 MED ORDER — OSELTAMIVIR PHOSPHATE 75 MG PO CAPS: 75.0000 mg | ORAL_CAPSULE | Freq: Two times a day (BID) | ORAL | 0 refills | Status: AC

## 2024-09-05 NOTE — ED Provider Notes (Signed)
 " CAY RALPH PELT    CSN: [id number] Arrival date & time: 09/05/24  1010      History   Chief Complaint Chief Complaint  Patient presents with   Nasal Congestion   Cough    HPI Abigail Nguyen is a 73 y.o. female.  Patient presents with 1.5 day history of congestion, cough, ear fullness.  Treatment attempted with Coricidin HBP and Robitussin.  No chest pain, shortness of breath, vomiting, diarrhea.  Her medical history includes hypertension.  The history is provided by the patient and medical records.    Past Medical History:  Diagnosis Date   Breast cyst    Hypertension    Hypothyroidism    PVCs (premature ventricular contractions)    Thyroid  disease     There are no active problems to display for this patient.   Past Surgical History:  Procedure Laterality Date   ANKLE SURGERY     APPENDECTOMY     BREAST CYST ASPIRATION Right 1980's   neg   CATARACT EXTRACTION W/PHACO Left 01/27/2023   Procedure: CATARACT EXTRACTION PHACO AND INTRAOCULAR LENS PLACEMENT (IOC) LEFT  10.83  01:04.0;  Surgeon: Jaye Fallow, MD;  Location: [facility];  Service: Ophthalmology;  Laterality: Left;   CATARACT EXTRACTION W/PHACO Right 05/05/2023   Procedure: CATARACT EXTRACTION PHACO AND INTRAOCULAR LENS PLACEMENT (IOC) RIGHT 6.70 00:43.1;  Surgeon: Jaye Fallow, MD;  Location: [facility];  Service: Ophthalmology;  Laterality: Right;   CESAREAN SECTION     CHOLECYSTECTOMY     COLONOSCOPY N/A 10/23/2022   Procedure: COLONOSCOPY;  Surgeon: Onita Elspeth Sharper, DO;  Location: [facility] ENDOSCOPY;  Service: Gastroenterology;  Laterality: N/A;   COLONOSCOPY WITH PROPOFOL  N/A 02/23/2017   Procedure: COLONOSCOPY WITH PROPOFOL ;  Surgeon: Viktoria Lamar DASEN, MD;  Location: [facility] END[facility]COPY;  Service: Endoscopy;  Laterality: N/A;   EYE SURGERY     laparascopy N/A 1981   diagnostic   TONSILLECTOMY      OB History   No obstetric history on file.      Home Medications     Prior to Admission medications  Medication Sig Start Date End Date Taking? Authorizing Provider  oseltamivir  (TAMIFLU ) 75 MG capsule Take 1 capsule (75 mg total) by mouth every 12 (twelve) hours. 09/05/24  Yes Corlis Burnard DEL, NP  calcium carbonate (TUMS - DOSED IN MG ELEMENTAL CALCIUM) 500 MG chewable tablet Chew 1 tablet by mouth daily.    [provider]  carvedilol (COREG) 3.125 MG tablet Take by mouth. 12/07/18   [provider]  levothyroxine (SYNTHROID, LEVOTHROID) 25 MCG tablet Take 25 mcg by mouth daily before breakfast.    [provider]  lisinopril (PRINIVIL,ZESTRIL) 10 MG tablet Take 10 mg by mouth daily.    [provider]    Family History Family History  Problem Relation Age of Onset   Colon cancer Mother    Coronary artery disease Father    Stroke Father    Breast cancer Maternal Grandmother 37    Social History Social History[1]   Allergies   Codeine, Morphine and codeine, and Compazine [prochlorperazine edisylate]   Review of Systems Review of Systems  Constitutional:  Negative for chills and fever.  HENT:  Positive for congestion, ear pain, postnasal drip, rhinorrhea and sinus pressure. Negative for sore throat.   Respiratory:  Positive for cough. Negative for shortness of breath.   Cardiovascular:  Negative for chest pain and palpitations.  Gastrointestinal:  Negative for diarrhea and vomiting.  Physical Exam Triage Vital Signs ED Triage Vitals  Encounter Vitals Group     BP 09/05/24 1217 138/80     Girls Systolic BP Percentile --      Girls Diastolic BP Percentile --      Boys Systolic BP Percentile --      Boys Diastolic BP Percentile --      Pulse Rate 09/05/24 1217 77     Resp 09/05/24 1217 18     Temp 09/05/24 1217 99.4 F (37.4 C)     Temp src --      SpO2 09/05/24 1217 97 %     Weight --      Height --      Head Circumference --      Peak Flow --      Pain Score 09/05/24 1236 6     Pain Loc --       Pain Education --      Exclude from Growth Chart --    No data found.  Updated Vital Signs BP 138/80   Pulse 77   Temp 99.4 F (37.4 C)   Resp 18   SpO2 97%   Visual Acuity Right Eye Distance:   Left Eye Distance:   Bilateral Distance:    Right Eye Near:   Left Eye Near:    Bilateral Near:     Physical Exam Constitutional:      General: She is not in acute distress.    Appearance: She is ill-appearing.  HENT:     Right Ear: Tympanic membrane normal.     Left Ear: Tympanic membrane normal.     Nose: Rhinorrhea present.     Mouth/Throat:     Mouth: Mucous membranes are moist.     Pharynx: Oropharynx is clear.  Cardiovascular:     Rate and Rhythm: Normal rate and regular rhythm.     Heart sounds: Normal heart sounds.  Pulmonary:     Effort: Pulmonary effort is normal. No respiratory distress.     Breath sounds: Normal breath sounds.  Neurological:     Mental Status: She is alert.      UC Treatments / Results  Labs (all labs ordered are listed, but only abnormal results are displayed) Labs Reviewed  POC COVID19/FLU A&B COMBO - Abnormal; Notable for the following components:      Result Value   Influenza A Antigen, POC Positive (*)    All other components within normal limits    EKG   Radiology No results found.  Procedures Procedures (including critical care time)  Medications Ordered in UC Medications - No data to display  Initial Impression / Assessment and Plan / UC Course  I have reviewed the triage vital signs and the nursing notes.  Pertinent labs & imaging results that were available during my care of the patient were reviewed by me and considered in my medical decision making (see chart for details).    Influenza A.  Lungs are clear and O2 sat is 97% on room air.  Patient has been symptomatic for 1.5 days.  Rapid flu test positive for influenza A.  COVID test is negative.  Treating with Tamiflu .  Discussed symptomatic treatment  including Tylenol or ibuprofen  as needed, rest, hydration.  Education provided on influenza.  Instructed patient to follow-up with her PCP.  ED precautions given.  Patient agrees to plan of care.   Final Clinical Impressions(s) / UC Diagnoses   Final diagnoses:  Influenza A  Discharge Instructions      Your test is positive for influenza A.  COVID is negative.     If desired, take the Tamiflu  as directed.  Take Tylenol or ibuprofen  as needed for fever or discomfort.    Follow up with your primary care provider tomorrow.  Go to the emergency department if you have worsening symptoms.        ED Prescriptions     Medication Sig Dispense Auth. Provider   oseltamivir  (TAMIFLU ) 75 MG capsule Take 1 capsule (75 mg total) by mouth every 12 (twelve) hours. 10 capsule Corlis Burnard DEL, NP      PDMP not reviewed this encounter.    [1]  Social History Tobacco Use   Smoking status: Never   Smokeless tobacco: Never  Vaping Use   Vaping status: Never Used  Substance Use Topics   Alcohol use: Not Currently    Comment: rarely   Drug use: No     Corlis Burnard DEL, NP 09/05/24 1258  "

## 2024-09-05 NOTE — ED Triage Notes (Signed)
 Patient to Urgent Care with complaints of painful cough/ sinus pain and burning/ chest congestion/ ear fullness.  Symptoms started Saturday night.  Taking cold and flu otc meds/ robitussin/ sinus decongestant.

## 2024-09-05 NOTE — Discharge Instructions (Addendum)
 Your test is positive for influenza A.  COVID is negative.     If desired, take the Tamiflu  as directed.  Take Tylenol or ibuprofen  as needed for fever or discomfort.    Follow up with your primary care provider tomorrow.  Go to the emergency department if you have worsening symptoms.

## 2024-09-21 ENCOUNTER — Other Ambulatory Visit: Payer: Self-pay | Admitting: Internal Medicine

## 2024-09-21 DIAGNOSIS — Z1231 Encounter for screening mammogram for malignant neoplasm of breast: Secondary | ICD-10-CM

## 2024-10-11 ENCOUNTER — Encounter

## 2024-10-13 ENCOUNTER — Ambulatory Visit
Admission: RE | Admit: 2024-10-13 | Discharge: 2024-10-13 | Disposition: A | Discharge status: Home / Self Care | Source: Ambulatory Visit | Attending: Internal Medicine | Admitting: Internal Medicine

## 2024-10-13 DIAGNOSIS — Z1231 Encounter for screening mammogram for malignant neoplasm of breast: Secondary | ICD-10-CM | POA: Insufficient documentation
